# Patient Record
Sex: Female | Born: 1995 | Race: Black or African American | Hispanic: No | Marital: Single | Smoking: Former smoker
Health system: Southern US, Community
[De-identification: ages and names within clinical notes are randomized; demographics above are authoritative.]

## PROBLEM LIST (undated history)

## (undated) ENCOUNTER — Emergency Department (HOSPITAL_COMMUNITY): Admission: EM | Discharge status: Absent without leave | Source: Home / Self Care

## (undated) ENCOUNTER — Ambulatory Visit (HOSPITAL_COMMUNITY): Admission: EM | Source: Home / Self Care

## (undated) DIAGNOSIS — F32A Depression, unspecified: Secondary | ICD-10-CM

## (undated) DIAGNOSIS — F909 Attention-deficit hyperactivity disorder, unspecified type: Secondary | ICD-10-CM

## (undated) DIAGNOSIS — F419 Anxiety disorder, unspecified: Secondary | ICD-10-CM

## (undated) DIAGNOSIS — B019 Varicella without complication: Secondary | ICD-10-CM

## (undated) DIAGNOSIS — F329 Major depressive disorder, single episode, unspecified: Secondary | ICD-10-CM

## (undated) DIAGNOSIS — I10 Essential (primary) hypertension: Secondary | ICD-10-CM

## (undated) DIAGNOSIS — F319 Bipolar disorder, unspecified: Secondary | ICD-10-CM

## (undated) HISTORY — PX: ROOT CANAL: SHX2363

---
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
## 2017-05-16 ENCOUNTER — Emergency Department (HOSPITAL_BASED_OUTPATIENT_CLINIC_OR_DEPARTMENT_OTHER)
Admission: EM | Admit: 2017-05-16 | Discharge: 2017-05-16 | Disposition: A | Discharge status: Home / Self Care | Payer: Self-pay | Attending: Emergency Medicine | Admitting: Emergency Medicine

## 2017-05-16 ENCOUNTER — Emergency Department (HOSPITAL_BASED_OUTPATIENT_CLINIC_OR_DEPARTMENT_OTHER): Payer: Self-pay

## 2017-05-16 ENCOUNTER — Other Ambulatory Visit: Payer: Self-pay

## 2017-05-16 ENCOUNTER — Encounter (HOSPITAL_BASED_OUTPATIENT_CLINIC_OR_DEPARTMENT_OTHER): Payer: Self-pay | Admitting: Adult Health

## 2017-05-16 DIAGNOSIS — A599 Trichomoniasis, unspecified: Secondary | ICD-10-CM | POA: Insufficient documentation

## 2017-05-16 DIAGNOSIS — N760 Acute vaginitis: Secondary | ICD-10-CM | POA: Insufficient documentation

## 2017-05-16 DIAGNOSIS — Z87891 Personal history of nicotine dependence: Secondary | ICD-10-CM | POA: Insufficient documentation

## 2017-05-16 DIAGNOSIS — N730 Acute parametritis and pelvic cellulitis: Secondary | ICD-10-CM | POA: Insufficient documentation

## 2017-05-16 DIAGNOSIS — B9689 Other specified bacterial agents as the cause of diseases classified elsewhere: Secondary | ICD-10-CM | POA: Insufficient documentation

## 2017-05-16 DIAGNOSIS — F909 Attention-deficit hyperactivity disorder, unspecified type: Secondary | ICD-10-CM | POA: Insufficient documentation

## 2017-05-16 DIAGNOSIS — N898 Other specified noninflammatory disorders of vagina: Secondary | ICD-10-CM | POA: Insufficient documentation

## 2017-05-16 DIAGNOSIS — Z79899 Other long term (current) drug therapy: Secondary | ICD-10-CM | POA: Insufficient documentation

## 2017-05-16 HISTORY — DX: Essential (primary) hypertension: I10

## 2017-05-16 LAB — URINALYSIS, MICROSCOPIC (REFLEX)

## 2017-05-16 LAB — URINALYSIS, ROUTINE W REFLEX MICROSCOPIC
Bilirubin Urine: NEGATIVE
Glucose, UA: NEGATIVE mg/dL
HGB URINE DIPSTICK: NEGATIVE
Ketones, ur: 15 mg/dL — AB
Nitrite: POSITIVE — AB
Protein, ur: NEGATIVE mg/dL
pH: 6.5 (ref 5.0–8.0)

## 2017-05-16 LAB — WET PREP, GENITAL
Sperm: NONE SEEN
Yeast Wet Prep HPF POC: NONE SEEN

## 2017-05-16 LAB — PREGNANCY, URINE: PREG TEST UR: NEGATIVE

## 2017-05-16 MED ORDER — ONDANSETRON 4 MG PO TBDP
ORAL_TABLET | ORAL | 0 refills | Status: DC
Start: 2017-05-16 — End: 2022-11-14

## 2017-05-16 MED ORDER — SODIUM CHLORIDE 0.9 % IV BOLUS (SEPSIS)
1000.0000 mL | Freq: Once | INTRAVENOUS | Status: AC
  Administered 2017-05-16: 1000 mL via INTRAVENOUS

## 2017-05-16 MED ORDER — METRONIDAZOLE 500 MG PO TABS
2000.0000 mg | ORAL_TABLET | Freq: Once | ORAL | Status: AC
  Administered 2017-05-16: 2000 mg via ORAL
  Filled 2017-05-16: qty 4

## 2017-05-16 MED ORDER — AZITHROMYCIN 250 MG PO TABS
1000.0000 mg | ORAL_TABLET | Freq: Once | ORAL | Status: AC
  Administered 2017-05-16: 1000 mg via ORAL
  Filled 2017-05-16: qty 4

## 2017-05-16 MED ORDER — CEFTRIAXONE SODIUM 250 MG IJ SOLR
250.0000 mg | Freq: Once | INTRAMUSCULAR | Status: AC
  Administered 2017-05-16: 250 mg via INTRAMUSCULAR
  Filled 2017-05-16: qty 250

## 2017-05-16 MED ORDER — DOXYCYCLINE HYCLATE 100 MG PO CAPS: 100.0000 mg | ORAL_CAPSULE | Freq: Two times a day (BID) | ORAL | 0 refills | Status: AC

## 2017-05-16 MED ORDER — METRONIDAZOLE 500 MG PO TABS: 500.0000 mg | ORAL_TABLET | Freq: Two times a day (BID) | ORAL | 0 refills | Status: DC

## 2017-05-16 MED ORDER — ONDANSETRON 8 MG PO TBDP
8.0000 mg | ORAL_TABLET | Freq: Once | ORAL | Status: AC
  Administered 2017-05-16: 8 mg via ORAL
  Filled 2017-05-16: qty 1
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
## 2017-05-16 NOTE — Discharge Instructions (Signed)
You're being discharged with antibiotics for pelvic inflammatory disease as well as BV, it is important you complete full courses of both of these. Please call today to schedule an appointment for follow-up with her gynecologist. You need to use condoms for protection until both of these antibiotic courses were completed. You may take ibuprofen or Tylenol as needed for pain, Zofran for nausea. These antibiotics can cause some upset stomach and diarrhea, please take with food, do not consume alcohol while taking these medications will cause severe vomiting. If you have fevers and chills, worsening lower abdominal pain, nausea, vomiting or unable to keep down fluids or food please return to the ED sooner otherwise follow-up with her gynecologist and primary doctor.

## 2017-05-16 NOTE — ED Notes (Signed)
PT is tolerating POs

## 2017-05-16 NOTE — ED Provider Notes (Signed)
[facility] EMERGENCY DEPARTMENT Provider Note   CSN: [id number] Arrival date & time: 05/16/17  [id number]     History   Chief Complaint Chief Complaint  Patient presents with  . Exposure to STD    HPI  Elizabeth Cameron is a 21 y.o. Female with a history of bipolar disorder, anxiety and previous STDs, presents complaining of 2-1/2 weeks of vaginal discharge and lower abdominal cramping. Patient reports thick yellowish discharge with foul odor, with some associated pelvic discomfort. Patient reports suprapubic abdominal pain and cramping for the past 2 weeks. Reports some burning with urination, no hematuria or flank pain. Patient reports decreased appetite for the past 2 weeks with some nausea and a few episodes of intermittent vomiting, no hematemesis. Patient denies focal abdominal pain, diarrhea or constipation. Denies measured fevers, but reports she's woken up a sweat several times in the past week. Patient reports she thinks that she got this from a previous ex-boyfriend, and now has a new sexual partner who is here with similar symptoms. Patient unsure when her last menstrual period was.      Past Medical History:  Diagnosis Date  . ADHD (attention deficit hyperactivity disorder)    vyvanse, none in a couple of weeks due to running out  . Anxiety   . Bipolar 1 disorder (HCC)   . Depression   . Varicella    remote history    Patient Active Problem List   Diagnosis Date Noted  . Oppositional defiant disorder 12/10/2011  . Bipolar affective disorder, mixed, severe (HCC) 12/09/2011  . Cannabis abuse 12/09/2011  . 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 No pertinent surgical history.  OB History    Gravida Para Term Preterm AB Living   0 0 0 0 0 0   SAB TAB Ectopic Multiple Live Births   0 0 0 0         Home Medications    Prior to Admission medications   Medication Sig Start Date End Date Taking?  Authorizing Provider  azithromycin (ZITHROMAX Z-PAK) 250 MG tablet Use as directed 03/21/13   Excell SeltzerBaker, Adrian BlackwaterZachary H, PA-C  etodolac (LODINE) 500 MG tablet Take 1 tablet (500 mg total) by mouth 2 (two) times daily. 03/21/13   Baker, Adrian BlackwaterZachary H, PA-C  guaiFENesin-codeine 100-10 MG/5ML syrup Take 5 mLs by mouth 3 (three) times daily as needed for cough. 03/21/13   Graylon GoodBaker, Zachary H, PA-C  HYDROcodone-acetaminophen (NORCO/VICODIN) 5-325 MG per tablet Take 1 tablet by mouth every 4 (four) hours as needed for pain. 03/29/13   Schinlever, Santina Evansatherine, PA-C  methylPREDNISolone (MEDROL DOSEPAK) 4 MG tablet follow package directions 03/21/13   Graylon GoodBaker, Zachary H, PA-C    Family History Family History  Problem Relation Age of Onset  . Depression Mother        Lorazepam, prestige  . Anxiety disorder Mother   . Bipolar disorder Mother        seroquel  . Schizophrenia Paternal Grandmother     Social History Social History   Tobacco Use  . Smoking status: Former Smoker    Packs/day: 0.10    Years: 0.60    Pack years: 0.06    Types: Cigarettes  . Smokeless tobacco: Never Used  . Tobacco comment: Gets nauseous when she smokes too much.   Interested in Engineer, maintenance (IT)learning coping skills for anxiety, stress  Substance Use Topics  . Alcohol use: Yes    Comment: Last use around Christmas time.  Got drunk,  got sick, denies use since then  . Drug use: No    Comment: last use a couple of days ago, occasional use.  2 blunts.     Allergies   Patient has no known allergies.   Review of Systems Review of Systems  Constitutional: Positive for chills and fever.  HENT: Negative for congestion, mouth sores, rhinorrhea and sore throat.   Respiratory: Negative for cough and shortness of breath.   Cardiovascular: Negative for chest pain.  Gastrointestinal: Positive for abdominal pain, nausea and vomiting. Negative for constipation and diarrhea.  Genitourinary: Positive for dysuria, pelvic pain and vaginal discharge. Negative  for flank pain, genital sores, hematuria and vaginal bleeding.  Musculoskeletal: Negative for arthralgias and myalgias.  Skin: Negative for rash.  Neurological: Negative for dizziness, weakness, numbness and headaches.     Physical Exam Updated Vital Signs BP 120/70 (BP Location: Right Arm)   Pulse 78   Temp 98.9 F (37.2 C) (Oral)   Resp 18   SpO2 98%   Physical Exam  Constitutional: She appears well-developed and well-nourished. No distress.  HENT:  Head: Normocephalic and atraumatic.  Mouth/Throat: Oropharynx is clear and moist.  Eyes: Right eye exhibits no discharge. Left eye exhibits no discharge.  Neck: Neck supple.  Cardiovascular: Normal rate, regular rhythm, normal heart sounds and intact distal pulses.  Pulmonary/Chest: Effort normal and breath sounds normal. No stridor. No respiratory distress. She has no wheezes. She has no rales.  Lungs CTA bilaterally  Abdominal: Soft. Bowel sounds are normal. She exhibits no distension. There is tenderness. There is guarding.  Abdomen tender to palpation across the lower quadrants and suprapubic region with some guarding, no upper abdominal tenderness, negative Murphy sign, no rebound tenderness, no CVA tenderness  Genitourinary:  Genitourinary Comments: There is a moderate amount of thick yellowish discharge coming from the cervical os with notable cervicitis, and several nabothian cysts present, erythema of the vaginal walls, on bimanual exam there is cervical motion tenderness, no obvious adnexal masses, but tenderness is greater on the left than the right  Musculoskeletal: She exhibits no edema or deformity.  Lymphadenopathy:    She has no cervical adenopathy.  Neurological: She is alert. Coordination normal.  Skin: Skin is warm and dry. Capillary refill takes less than 2 seconds. She is not diaphoretic.  Psychiatric: She has a normal mood and affect. Her behavior is normal.  Nursing note and vitals reviewed.    ED  Treatments / Results  Labs (all labs ordered are listed, but only abnormal results are displayed) Labs Reviewed  WET PREP, GENITAL - Abnormal; Notable for the following components:      Result Value   Trich, Wet Prep PRESENT (*)    Clue Cells Wet Prep HPF POC PRESENT (*)    WBC, Wet Prep HPF POC MANY (*)    All other components within normal limits  URINALYSIS, ROUTINE W REFLEX MICROSCOPIC - Abnormal; Notable for the following components:   Specific Gravity, Urine >1.030 (*)    Ketones, ur 15 (*)    Nitrite POSITIVE (*)    Leukocytes, UA SMALL (*)    All other components within normal limits  URINALYSIS, MICROSCOPIC (REFLEX) - Abnormal; Notable for the following components:   Bacteria, UA MANY (*)    Squamous Epithelial / LPF 0-5 (*)    All other components within normal limits  PREGNANCY, URINE  RPR  HIV ANTIBODY (ROUTINE TESTING)  GC/CHLAMYDIA PROBE AMP ([place]) NOT AT [facility]    EKG  EKG Interpretation None       Radiology US Transvaginal Non-ob  Result Date: 05/16/2017 CLINICAL DATA:  Pelvic pain, vaginal discharge EXAM: TRANSABDOMINAL AND TRANSVAGINAL ULTRASOUND OF PELVIS DOPPLER ULTRASOUND OF OVARIES TECHNIQUE: Both transabdominal and transvaginal ultrasound examinations of the pelvis were performed. Transabdominal technique was performed for global imaging of the pelvis including uterus, ovaries, adnexal regions, and pelvic cul-de-sac. It was necessary to proceed with endovaginal exam following the transabdominal exam to visualize the endometrium and ovaries. Color and duplex Doppler ultrasound was utilized to evaluate blood flow to the ovaries. COMPARISON:  None. FINDINGS: Uterus Measurements: 9.9 x 5.1 x 6.9 cm. No fibroids or other mass visualized. Endometrium Thickness: 14 mm in thickness.  No focal abnormality visualized. Right ovary Measurements: 4.4 x 3.2 x 4.1 cm. Dominant follicle or corpus luteum cyst measures 3.2 cm. No adnexal mass. Left ovary Measurements:  2.7 x 1.9 x 3.0 cm. Normal appearance/no adnexal mass. Pulsed Doppler evaluation of both ovaries demonstrates normal low-resistance arterial and venous waveforms. Other findings Trace free fluid. IMPRESSION: No evidence of ovarian torsion.  Unremarkable study. Electronically Signed   By: Charlett Nose M.D.   On: 05/16/2017 12:44   US Pelvis Complete  Result Date: 05/16/2017 CLINICAL DATA:  Pelvic pain, vaginal discharge EXAM: TRANSABDOMINAL AND TRANSVAGINAL ULTRASOUND OF PELVIS DOPPLER ULTRASOUND OF OVARIES TECHNIQUE: Both transabdominal and transvaginal ultrasound examinations of the pelvis were performed. Transabdominal technique was performed for global imaging of the pelvis including uterus, ovaries, adnexal regions, and pelvic cul-de-sac. It was necessary to proceed with endovaginal exam following the transabdominal exam to visualize the endometrium and ovaries. Color and duplex Doppler ultrasound was utilized to evaluate blood flow to the ovaries. COMPARISON:  None. FINDINGS: Uterus Measurements: 9.9 x 5.1 x 6.9 cm. No fibroids or other mass visualized. Endometrium Thickness: 14 mm in thickness.  No focal abnormality visualized. Right ovary Measurements: 4.4 x 3.2 x 4.1 cm. Dominant follicle or corpus luteum cyst measures 3.2 cm. No adnexal mass. Left ovary Measurements: 2.7 x 1.9 x 3.0 cm. Normal appearance/no adnexal mass. Pulsed Doppler evaluation of both ovaries demonstrates normal low-resistance arterial and venous waveforms. Other findings Trace free fluid. IMPRESSION: No evidence of ovarian torsion.  Unremarkable study. Electronically Signed   By: Charlett Nose M.D.   On: 05/16/2017 12:44   [place] Art/ven Flow Abd Pelv Doppler  Result Date: 05/16/2017 CLINICAL DATA:  Pelvic pain, vaginal discharge EXAM: TRANSABDOMINAL AND TRANSVAGINAL ULTRASOUND OF PELVIS DOPPLER ULTRASOUND OF OVARIES TECHNIQUE: Both transabdominal and transvaginal ultrasound examinations of the pelvis were performed. Transabdominal  technique was performed for global imaging of the pelvis including uterus, ovaries, adnexal regions, and pelvic cul-de-sac. It was necessary to proceed with endovaginal exam following the transabdominal exam to visualize the endometrium and ovaries. Color and duplex Doppler ultrasound was utilized to evaluate blood flow to the ovaries. COMPARISON:  None. FINDINGS: Uterus Measurements: 9.9 x 5.1 x 6.9 cm. No fibroids or other mass visualized. Endometrium Thickness: 14 mm in thickness.  No focal abnormality visualized. Right ovary Measurements: 4.4 x 3.2 x 4.1 cm. Dominant follicle or corpus luteum cyst measures 3.2 cm. No adnexal mass. Left ovary Measurements: 2.7 x 1.9 x 3.0 cm. Normal appearance/no adnexal mass. Pulsed Doppler evaluation of both ovaries demonstrates normal low-resistance arterial and venous waveforms. Other findings Trace free fluid. IMPRESSION: No evidence of ovarian torsion.  Unremarkable study. Electronically Signed   By: Charlett Nose M.D.   On: 05/16/2017 12:44    Procedures Procedures (including critical care  time)  Medications Ordered in ED Medications  sodium chloride 0.9 % bolus 1,000 mL (0 mLs Intravenous Stopped 05/16/17 1248)  cefTRIAXone (ROCEPHIN) injection 250 mg (250 mg Intramuscular Given 05/16/17 1131)  azithromycin (ZITHROMAX) tablet 1,000 mg (1,000 mg Oral Given 05/16/17 1129)  metroNIDAZOLE (FLAGYL) tablet 2,000 mg (2,000 mg Oral Given 05/16/17 1129)  ondansetron (ZOFRAN-ODT) disintegrating tablet 8 mg (8 mg Oral Given 05/16/17 1129)     Initial Impression / Assessment and Plan / ED Course  I have reviewed the triage vital signs and the nursing notes.  Pertinent labs & imaging results that were available during my care of the patient were reviewed by me and considered in my medical decision making (see chart for details).  Patient presents with 2-1/2 weeks of vaginal discharge and lower abdominal pain, with some associated nausea, decreased appetite and  chills. Patient is overall well-appearing, and vitals are normal. On exam patient has some lower abdominal tenderness, pelvic exam reveals purulent discharge, as well as cervical motion tenderness and adnexal tenderness on the left. Wet prep and GC/Chlamydia cultures obtained during exam. Given cervical motion and left adnexal tenderness, will get ultrasound as well to rule out TOA. Pt endorses some nausea, as well as decreased appetite will give Zofran and IV fluids.  Urinalysis shows evidence of infection, but given copious vaginal discharge on exam, this is likely the source. Urine pregnancy is negative. Wet prep shows Trichomonas, clue cells and many WBCs. HIV, RPR and GC/Chlamydia are pending. Pelvic ultrasound was unremarkable with no evidence of TOA or ovarian torsion. Will treat patient today for trichomoniasis, and initiate treatment for PID, Flagyl, azithromycin and ceftriaxone given today, patient will be sent home with doxycycline for 2 weeks, as well as Flagyl for BV. Counseled patient on side effects of these medications, and not to drink alcohol. Stressed importance of follow-up with her gynecologist, and the use of condoms for protection, as patient has had multiple STDs. Patient is tolerating PO in the ED without issue, and is stable for discharge home, with gynecology follow-up. Strict return precautions discussed patient expresses understanding and is in agreement with plan  Final Clinical Impressions(s) / ED Diagnoses   Final diagnoses:  Vaginal discharge  BV (bacterial vaginosis)  Trichomoniasis  PID (acute pelvic inflammatory disease)    ED Discharge Orders        Ordered    metroNIDAZOLE (FLAGYL) 500 MG tablet  2 times daily     05/16/17 1313    doxycycline (VIBRAMYCIN) 100 MG capsule  2 times daily     05/16/17 1313    ondansetron (ZOFRAN ODT) 4 MG disintegrating tablet     05/16/17 1321       Jodi Geralds [place], [place] 05/17/17 0836    Ward, Layla Maw, DO 05/22/17  2305

## 2017-05-17 LAB — HIV ANTIBODY (ROUTINE TESTING W REFLEX): HIV SCREEN 4TH GENERATION: NONREACTIVE

## 2017-05-17 LAB — GC/CHLAMYDIA PROBE AMP (~~LOC~~) NOT AT ARMC
Chlamydia: NEGATIVE
NEISSERIA GONORRHEA: POSITIVE — AB

## 2017-05-17 LAB — RPR: RPR Ser Ql: NONREACTIVE

## 2017-07-02 ENCOUNTER — Encounter (HOSPITAL_COMMUNITY): Payer: Self-pay

## 2017-07-02 DIAGNOSIS — R102 Pelvic and perineal pain: Secondary | ICD-10-CM | POA: Diagnosis present

## 2017-07-02 DIAGNOSIS — Z87891 Personal history of nicotine dependence: Secondary | ICD-10-CM | POA: Diagnosis not present

## 2017-07-02 DIAGNOSIS — I10 Essential (primary) hypertension: Secondary | ICD-10-CM | POA: Insufficient documentation

## 2017-07-02 DIAGNOSIS — N83201 Unspecified ovarian cyst, right side: Secondary | ICD-10-CM | POA: Insufficient documentation

## 2017-07-02 DIAGNOSIS — Z79899 Other long term (current) drug therapy: Secondary | ICD-10-CM | POA: Diagnosis not present

## 2017-07-02 DIAGNOSIS — F909 Attention-deficit hyperactivity disorder, unspecified type: Secondary | ICD-10-CM | POA: Diagnosis not present

## 2017-07-02 LAB — CBC
HCT: 38.7 % (ref 36.0–46.0)
Hemoglobin: 12.9 g/dL (ref 12.0–15.0)
MCH: 28.7 pg (ref 26.0–34.0)
MCHC: 33.3 g/dL (ref 30.0–36.0)
MCV: 86 fL (ref 78.0–100.0)
PLATELETS: 205 10*3/uL (ref 150–400)
RBC: 4.5 MIL/uL (ref 3.87–5.11)
RDW: 14 % (ref 11.5–15.5)
WBC: 6.2 10*3/uL (ref 4.0–10.5)

## 2017-07-02 LAB — URINALYSIS, ROUTINE W REFLEX MICROSCOPIC
Bilirubin Urine: NEGATIVE
GLUCOSE, UA: NEGATIVE mg/dL
Hgb urine dipstick: NEGATIVE
Ketones, ur: NEGATIVE mg/dL
LEUKOCYTES UA: NEGATIVE
NITRITE: NEGATIVE
PROTEIN: NEGATIVE mg/dL
Specific Gravity, Urine: 1.028 (ref 1.005–1.030)
pH: 5 (ref 5.0–8.0)

## 2017-07-02 LAB — COMPREHENSIVE METABOLIC PANEL
ALT: 23 U/L (ref 14–54)
AST: 18 U/L (ref 15–41)
Albumin: 4.1 g/dL (ref 3.5–5.0)
Alkaline Phosphatase: 51 U/L (ref 38–126)
Anion gap: 9 (ref 5–15)
BUN: 9 mg/dL (ref 6–20)
CHLORIDE: 108 mmol/L (ref 101–111)
CO2: 20 mmol/L — ABNORMAL LOW (ref 22–32)
CREATININE: 1.05 mg/dL — AB (ref 0.44–1.00)
Calcium: 8.7 mg/dL — ABNORMAL LOW (ref 8.9–10.3)
GFR calc non Af Amer: >60 mL/min (ref >60)
Glucose, Bld: 120 mg/dL — ABNORMAL HIGH (ref 65–99)
POTASSIUM: 3.2 mmol/L — AB (ref 3.5–5.1)
Sodium: 137 mmol/L (ref 135–145)
TOTAL PROTEIN: 6.5 g/dL (ref 6.5–8.1)
Total Bilirubin: 0.4 mg/dL (ref 0.3–1.2)

## 2017-07-02 LAB — I-STAT BETA HCG BLOOD, ED (MC, WL, AP ONLY)

## 2017-07-02 LAB — LIPASE, BLOOD: LIPASE: 29 U/L (ref 11–51)

## 2017-07-02 NOTE — ED Triage Notes (Signed)
C/o lower abdominal pain for the last 3 days, hx of cysts.  Pt states she recently had an ultrasound in December and was told she has a current cyst.

## 2017-07-03 ENCOUNTER — Emergency Department (HOSPITAL_COMMUNITY)
Admission: EM | Admit: 2017-07-03 | Discharge: 2017-07-03 | Disposition: A | Discharge status: Home / Self Care | Payer: Medicaid Other | Attending: Emergency Medicine | Admitting: Emergency Medicine

## 2017-07-03 ENCOUNTER — Emergency Department (HOSPITAL_COMMUNITY): Payer: Medicaid Other

## 2017-07-03 DIAGNOSIS — N83201 Unspecified ovarian cyst, right side: Secondary | ICD-10-CM

## 2017-07-03 LAB — WET PREP, GENITAL
Clue Cells Wet Prep HPF POC: NONE SEEN
Sperm: NONE SEEN
TRICH WET PREP: NONE SEEN
YEAST WET PREP: NONE SEEN

## 2017-07-03 LAB — GC/CHLAMYDIA PROBE AMP (~~LOC~~) NOT AT ARMC

## 2017-07-03 MED ORDER — PROMETHAZINE HCL 25 MG PO TABS: 25.0000 mg | ORAL_TABLET | Freq: Four times a day (QID) | ORAL | 0 refills | Status: DC | PRN

## 2017-07-03 MED ORDER — IBUPROFEN 800 MG PO TABS
800.0000 mg | ORAL_TABLET | Freq: Once | ORAL | Status: AC
  Administered 2017-07-03: 800 mg via ORAL
  Filled 2017-07-03: qty 1

## 2017-07-03 MED ORDER — PROMETHAZINE HCL 25 MG PO TABS
25.0000 mg | ORAL_TABLET | Freq: Once | ORAL | Status: AC
  Administered 2017-07-03: 25 mg via ORAL
  Filled 2017-07-03: qty 1

## 2017-07-03 NOTE — ED Notes (Signed)
ED Provider at bedside. 

## 2017-07-03 NOTE — Discharge Instructions (Signed)
You may alternate Tylenol 1000 mg every 6 hours as needed for pain and Ibuprofen 800 mg every 8 hours as needed for pain.  Please take Ibuprofen with food.  Please schedule an appointment to follow-[facility].     [email or web address] [address] [place], [place] [phone]    [facility] [email or web address] [address] [place], [place] [phone]    [facility] [address] [place], [place] [phone]   [facility] [email or web address] [address] [place], [place] [phone]   [facility] [email or web address] [address] [place], [place] [phone]   [facility] [email or web address] [address] [place], [place] [phone]

## 2017-07-03 NOTE — ED Notes (Signed)
Patient transported to Ultrasound 

## 2017-07-03 NOTE — ED Provider Notes (Signed)
TIME SEEN: 1:26 AM  CHIEF COMPLAINT: Pelvic pain  HPI: Patient is a 22 year old G3 P3 who presents to the emergency department with complaints of pelvic pain, vaginal discharge.  Was seen here on December 11 and had an ultrasound which showed right corpus luteum cyst measuring 3.2 cm versus dominant follicle.  She was positive for trichomonas, BV and ended up having a positive gonorrhea test.  She was treated with Flagyl for a week as well as doxycycline for 2 weeks.  Given azithromycin and ceftriaxone IM in the emergency department prior to discharge reports symptoms improved but now came back for the past couple of days.  Describes it as a pressure, sharp pain.  Has had vomiting while taking the doxycycline.  States she did not take the last 5 pills of her doxycycline prescription.  She is sexually active with the same partner who also tested positive for STDs.  He reports that he was treated and has not had any symptoms.  She states that she thinks he has been monogamous.  ROS: See HPI Constitutional: no fever  Eyes: no drainage  ENT: no runny nose   Cardiovascular:  no chest pain  Resp: no SOB  GI: no vomiting GU: no dysuria Integumentary: no rash  Allergy: no hives  Musculoskeletal: no leg swelling  Neurological: no slurred speech ROS otherwise negative  PAST MEDICAL HISTORY/PAST SURGICAL HISTORY:  Past Medical History:  Diagnosis Date  . ADHD (attention deficit hyperactivity disorder)    vyvanse, none in a couple of weeks due to running out  . Anxiety   . Bipolar 1 disorder (HCC)   . Depression   . Hypertension    takes Procardia 30mg   . Varicella    remote history    MEDICATIONS:  Prior to Admission medications   Medication Sig Start Date End Date Taking? Authorizing Provider  azithromycin (ZITHROMAX Z-PAK) 250 MG tablet Use as directed 03/21/13   Excell SeltzerBaker, Adrian BlackwaterZachary H, PA-C  etodolac (LODINE) 500 MG tablet Take 1 tablet (500 mg total) by mouth 2 (two) times daily. 03/21/13    Baker, Adrian BlackwaterZachary H, PA-C  guaiFENesin-codeine 100-10 MG/5ML syrup Take 5 mLs by mouth 3 (three) times daily as needed for cough. 03/21/13   Graylon GoodBaker, Zachary H, PA-C  HYDROcodone-acetaminophen (NORCO/VICODIN) 5-325 MG per tablet Take 1 tablet by mouth every 4 (four) hours as needed for pain. 03/29/13   Schinlever, Santina Evansatherine, PA-C  methylPREDNISolone (MEDROL DOSEPAK) 4 MG tablet follow package directions 03/21/13   Excell SeltzerBaker, Adrian BlackwaterZachary H, PA-C  metroNIDAZOLE (FLAGYL) 500 MG tablet Take 1 tablet (500 mg total) by mouth 2 (two) times daily. One po bid x 7 days 05/16/17   Dartha LodgeFord, Kelsey N, PA-C  ondansetron [facility](ZOFRAN ODT) 4 MG disintegrating tablet 4mg  ODT q4 hours prn nausea/vomit 05/16/17   Dartha LodgeFord, Kelsey N, PA-C    ALLERGIES:  Allergies  Allergen Reactions  . Penicillins Anaphylaxis    SOCIAL HISTORY:  Social History   Tobacco Use  . Smoking status: Former Smoker    Packs/day: 0.10    Years: 0.60    Pack years: 0.06    Types: Cigarettes  . Smokeless tobacco: Never Used  . Tobacco comment: Gets nauseous when she smokes too much.   Interested in Engineer, maintenance (IT)learning coping skills for anxiety, stress  Substance Use Topics  . Alcohol use: Yes    Comment: Last use around Christmas time.  Got drunk, got sick, denies use since then    FAMILY HISTORY: Family History  Problem Relation Age of Onset  .  Depression Mother        Lorazepam, prestige  . Anxiety disorder Mother   . Bipolar disorder Mother        seroquel  . Schizophrenia Paternal Grandmother     EXAM: BP 113/70 (BP Location: Right Arm)   Pulse 86   Temp 98 F (36.7 C) (Oral)   Resp 18   Ht 5\' 1"  (1.549 m)   Wt 65.8 kg (145 lb)   SpO2 100%   BMI 27.40 kg/m  CONSTITUTIONAL: Alert and oriented and responds appropriately to questions. Well-appearing; well-nourished HEAD: Normocephalic EYES: Conjunctivae clear, pupils appear equal, EOMI ENT: normal nose; moist mucous membranes NECK: Supple, no meningismus, no nuchal rigidity, no LAD  CARD:  RRR; S1 and S2 appreciated; no murmurs, no clicks, no rubs, no gallops RESP: Normal chest excursion without splinting or tachypnea; breath sounds clear and equal bilaterally; no wheezes, no rhonchi, no rales, no hypoxia or respiratory distress, speaking full sentences ABD/GI: Normal bowel sounds; non-distended; soft, non-tender, no rebound, no guarding, no peritoneal signs, no hepatosplenomegaly, no tenderness at McBurney's point GU:  Normal external genitalia. No lesions, rashes noted. Patient has no vaginal bleeding on exam.  No significant vaginal discharge.  Patient has no cervical motion tenderness on exam.  She does have bilateral adnexal tenderness worse on the right side than the left.  Cervix is not appear friable.  Cervix is closed.  Chaperone present for exam. BACK:  The back appears normal and is non-tender to palpation, there is no CVA tenderness EXT: Normal ROM in all joints; non-tender to palpation; no edema; normal capillary refill; no cyanosis, no calf tenderness or swelling    SKIN: Normal color for age and race; warm; no rash NEURO: Moves all extremities equally PSYCH: The patient's mood and manner are appropriate. Grooming and personal hygiene are appropriate.  MEDICAL DECISION MAKING: Patient here with pelvic pain.  Urine shows no sign of infection.  She is not pregnant.  Recent corpus luteum cyst seen on ultrasound.  Requesting another ultrasound today which I feel is reasonable given she was positive for gonorrhea and trichomonas during her last visit.  Did not complete a course of doxycycline.  Still sexually active with the same partner.  He states he was treated.  She was negative for HIV, syphilis and chlamydia during her last visit.  Will obtain ultrasound to evaluate for another cyst versus torsion but also TOA.  Doubt appendicitis.  Will give ibuprofen and Phenergan for symptomatic relief.  ED PROGRESS: Patient's current chlamydia test pending.  Wet prep shows nothing  significant.  Ultrasound shows benign physiologic right ovarian cyst.  No torsion.  She had no cervical motion tenderness on exam and no significant discharge.  Have advised her to follow-up with her OB/GYN and [place].  Recommended continue ibuprofen and alternating with Tylenol for pain.  Will discharge with prescription of Phenergan.  I do not feel this time she needs further treatment for PID.   At this time, I do not feel there is any life-threatening condition present. I have reviewed and discussed all results (EKG, imaging, lab, urine as appropriate) and exam findings with patient/family. I have reviewed nursing notes and appropriate previous records.  I feel the patient is safe to be discharged home without further emergent workup and can continue workup as an outpatient as needed. Discussed usual and customary return precautions. Patient/family verbalize understanding and are comfortable with this plan.  Outpatient follow-up has been provided if needed. All questions have  been answered.      Sidhant Helderman, Layla Maw, DO 07/03/17 [phone]

## 2019-01-01 ENCOUNTER — Ambulatory Visit (INDEPENDENT_AMBULATORY_CARE_PROVIDER_SITE_OTHER): Payer: Self-pay | Admitting: Licensed Clinical Social Worker

## 2019-01-01 DIAGNOSIS — F332 Major depressive disorder, recurrent severe without psychotic features: Secondary | ICD-10-CM

## 2019-01-03 ENCOUNTER — Encounter (HOSPITAL_COMMUNITY): Payer: Self-pay | Admitting: Licensed Clinical Social Worker

## 2019-01-03 NOTE — Progress Notes (Signed)
Elizabeth Cameron is a 23 y.o. female patient who presents for assessment for medications and talk therapy. PT expressed interest in specific grief counseling for child loss and women's issues. An outside referral was made to [facility] in [place]. Contact information was provided and PT agreed to f/u w/ referral as needed.         Archie Balboa, LCAS-A

## 2019-01-29 ENCOUNTER — Ambulatory Visit (HOSPITAL_COMMUNITY): Payer: Medicaid Other | Admitting: Psychiatry

## 2019-01-29 ENCOUNTER — Other Ambulatory Visit: Payer: Self-pay

## 2019-02-10 IMAGING — US US TRANSVAGINAL NON-OB
1 series · 13 of 25 positions shown · non-contrast
Comparison: None.

CLINICAL DATA: Pelvic pain, vaginal discharge

EXAM:
TRANSABDOMINAL AND TRANSVAGINAL ULTRASOUND OF PELVIS
DOPPLER ULTRASOUND OF OVARIES
TECHNIQUE: Both transabdominal and transvaginal ultrasound examinations of the
pelvis were performed. Transabdominal technique was performed for
global imaging of the pelvis including uterus, ovaries, adnexal
regions, and pelvic cul-de-sac.
It was necessary to proceed with endovaginal exam following the
transabdominal exam to visualize the endometrium and ovaries. Color
and duplex Doppler ultrasound was utilized to evaluate blood flow to
the ovaries.

[Series 1: us transvaginal non-ob · 0.22mm/px · 13 of 109 slices shown]
[im 1/109]
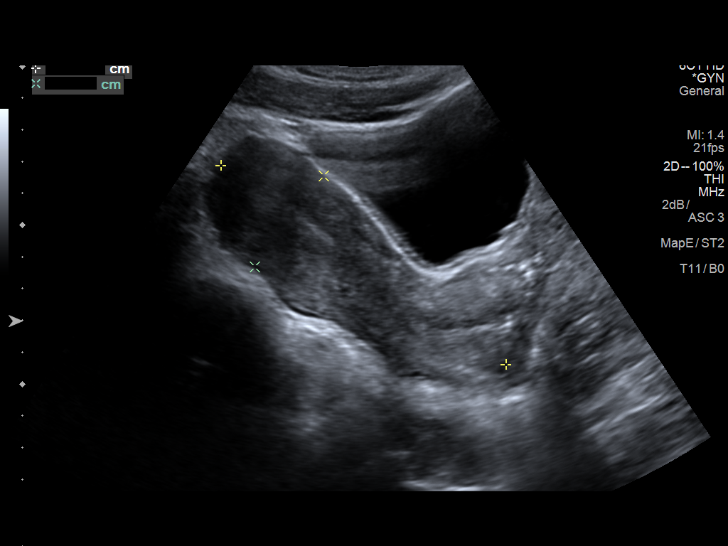
[im 10/109]
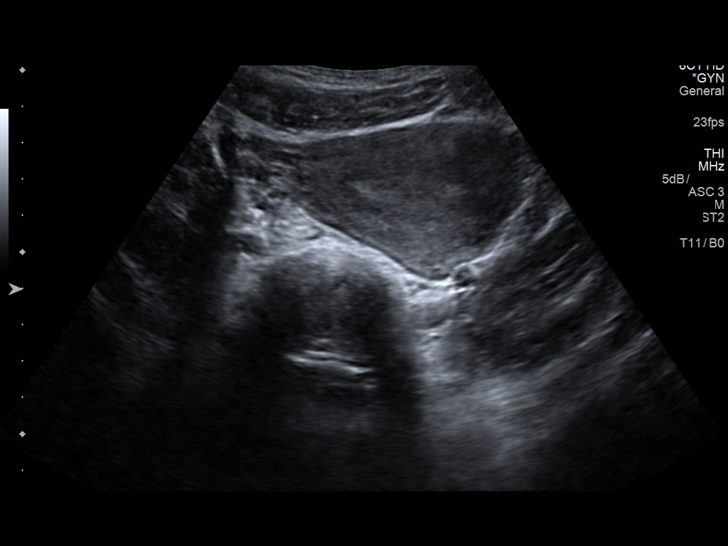
[im 19/109]
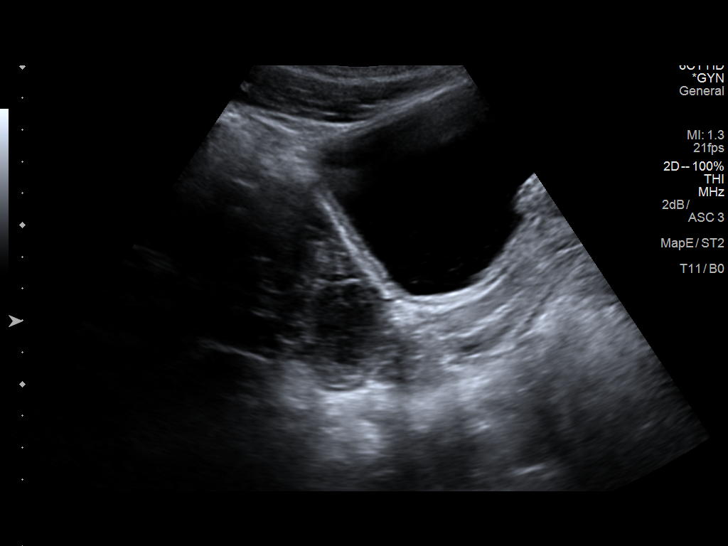
[im 28/109]
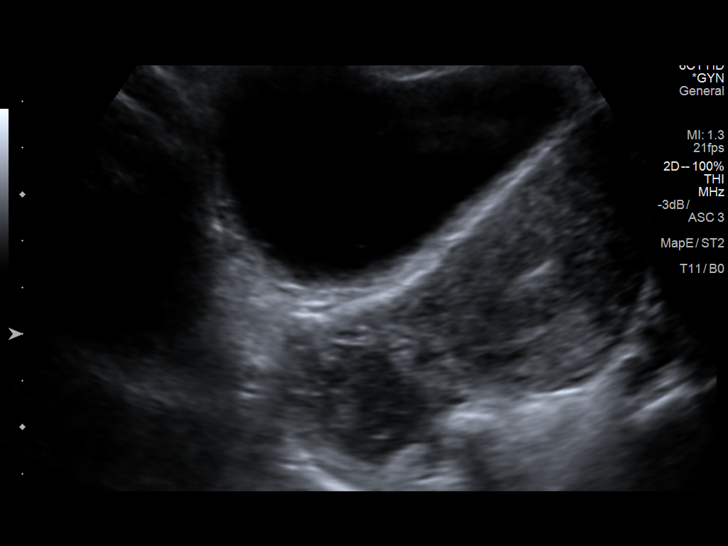
[im 37/109]
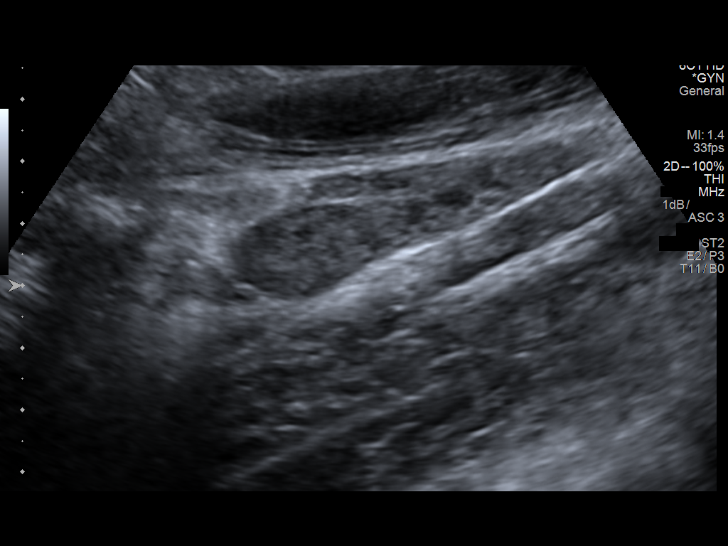
[im 46/109]
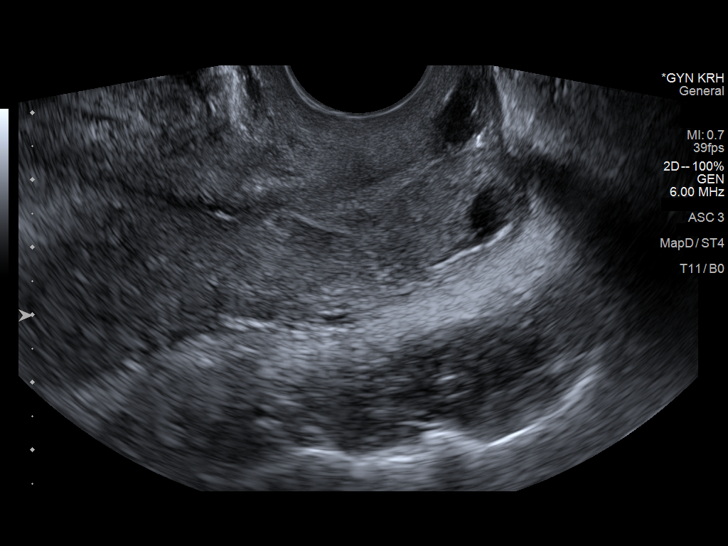
[im 55/109]
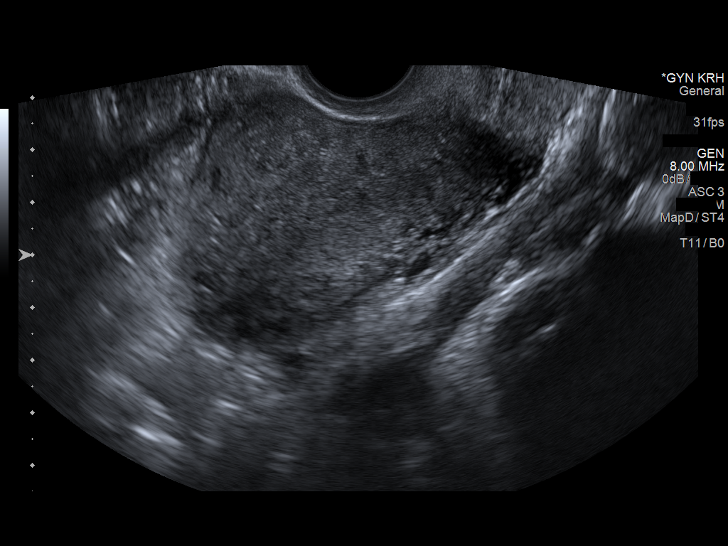
[im 64/109]
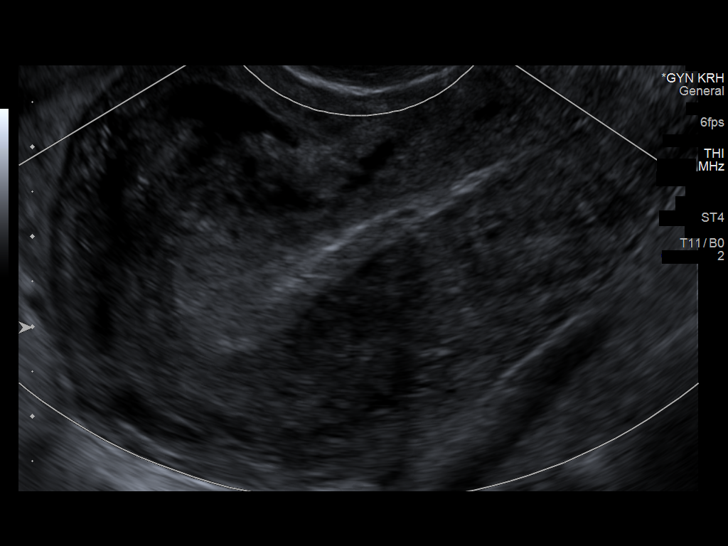
[im 73/109]
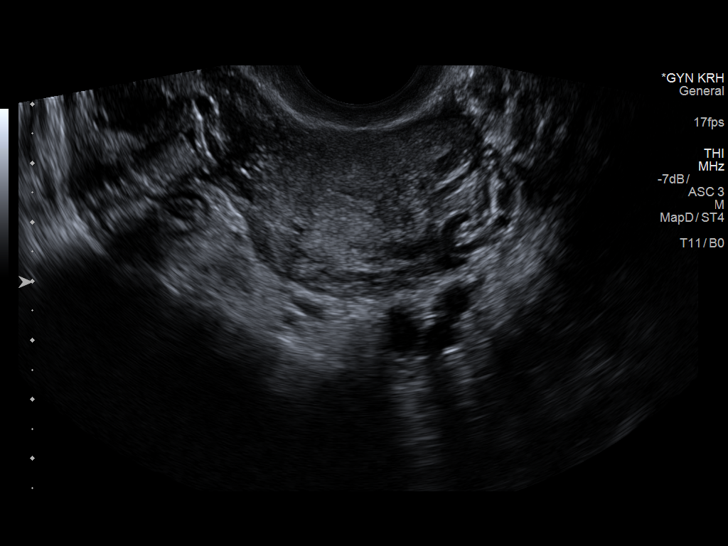
[im 82/109]
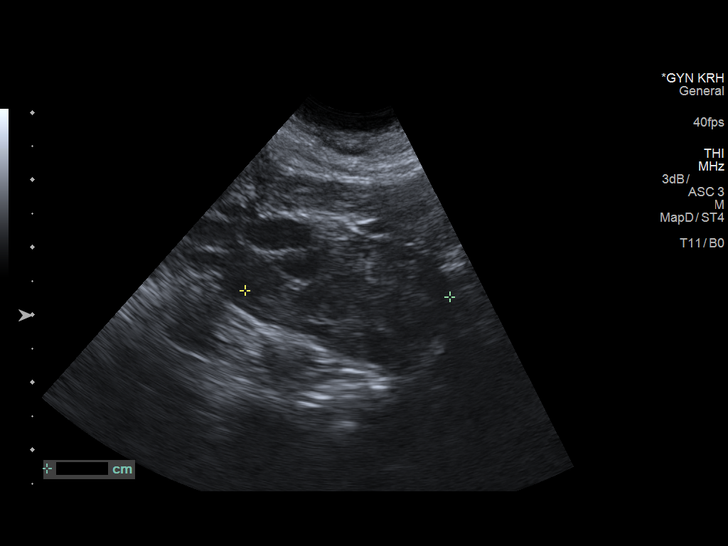
[im 91/109]
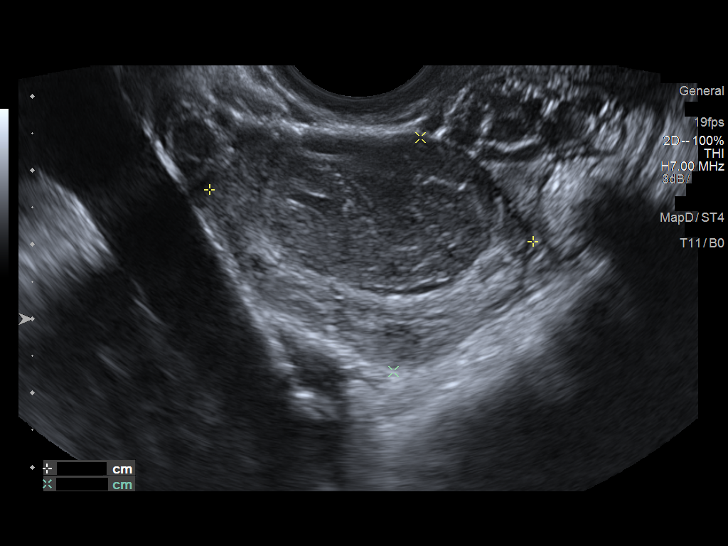
[im 100/109]
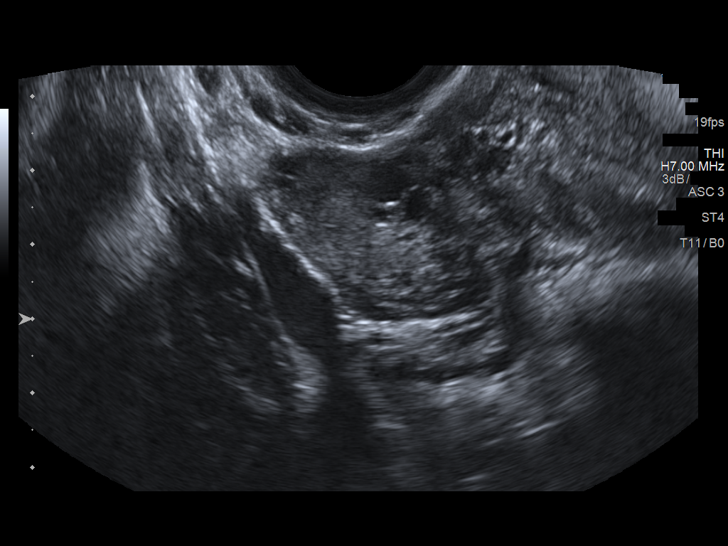
[im 109/109]
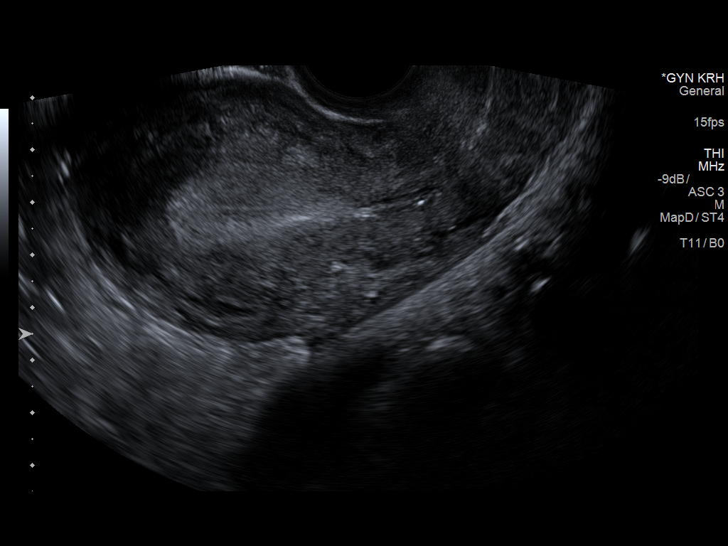

[13 of 25 positions shown; findings below may reference images not displayed]

FINDINGS: Uterus

Measurements: 9.9 x 5.1 x 6.9 cm. No fibroids or other mass
visualized.

Endometrium

Thickness: 14 mm in thickness.  No focal abnormality visualized.

Right ovary

Measurements: 4.4 x 3.2 x 4.1 cm. Dominant follicle or corpus luteum
cyst measures 3.2 cm.. No adnexal mass.

Left ovary

Measurements: 2.7 x 1.9 x 3.0 cm. Normal appearance/no adnexal mass.

Pulsed Doppler evaluation of both ovaries demonstrates normal
low-resistance arterial and venous waveforms.

Other findings

Trace free fluid.
IMPRESSION: No evidence of ovarian torsion.  Unremarkable study.

## 2019-03-30 IMAGING — US US TRANSVAGINAL NON-OB
1 series · 13 of 25 positions shown · non-contrast
Comparison: 05/16/2017

CLINICAL DATA: Right greater than left adnexal pelvic pain.

EXAM:
TRANSABDOMINAL AND TRANSVAGINAL ULTRASOUND OF PELVIS
DOPPLER ULTRASOUND OF OVARIES
TECHNIQUE: Both transabdominal and transvaginal ultrasound examinations of the
pelvis were performed. Transabdominal technique was performed for
global imaging of the pelvis including uterus, ovaries, adnexal
regions, and pelvic cul-de-sac.
It was necessary to proceed with endovaginal exam following the
transabdominal exam to visualize the endometrium and ovaries.. Color
and duplex Doppler ultrasound was utilized to evaluate blood flow to
the ovaries.

[Series 1: us transvaginal non-ob · 0.21mm/px · 13 of 78 slices shown]
[im 1/78]
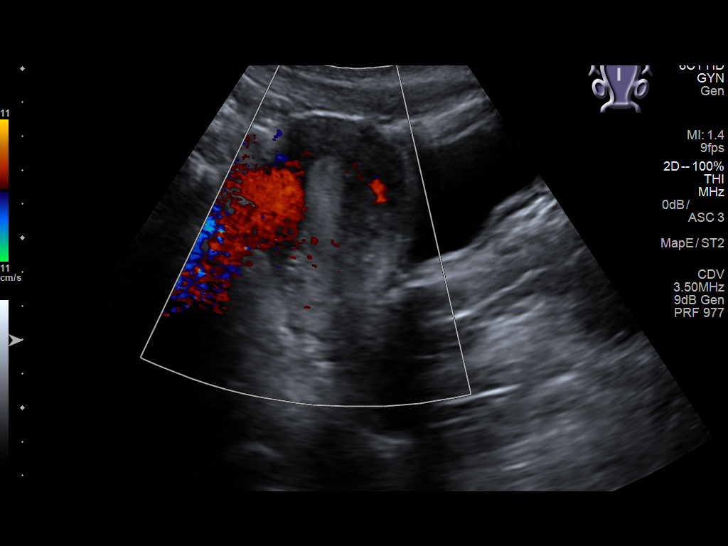
[im 7/78]
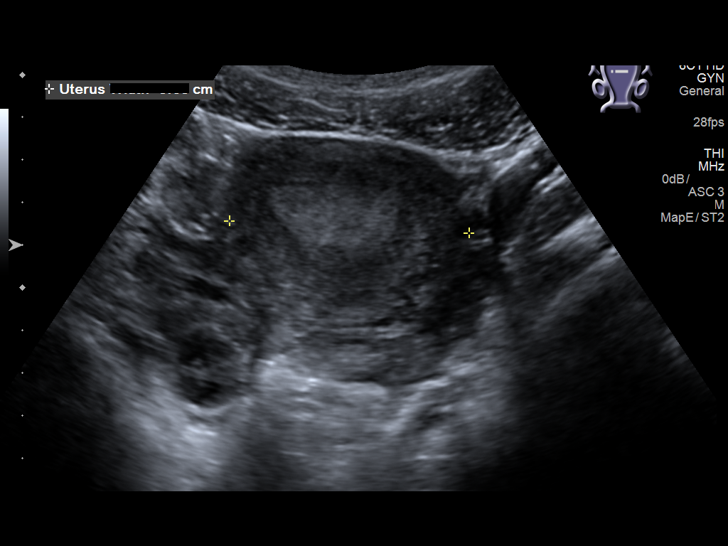
[im 13/78]
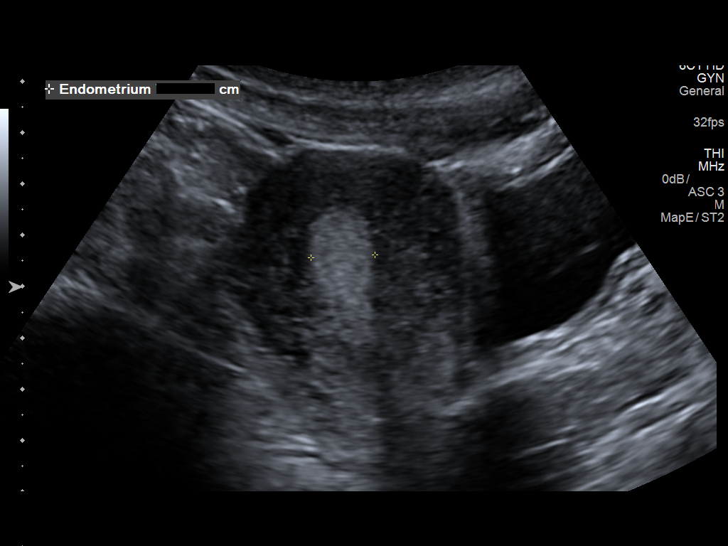
[im 20/78]
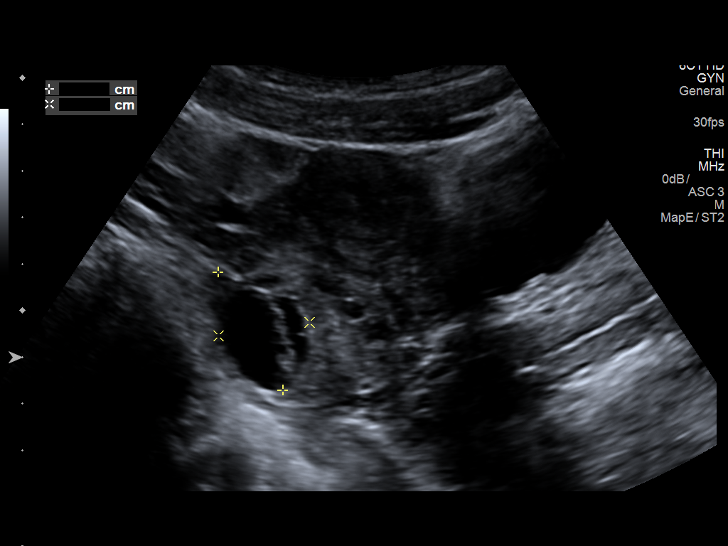
[im 26/78]
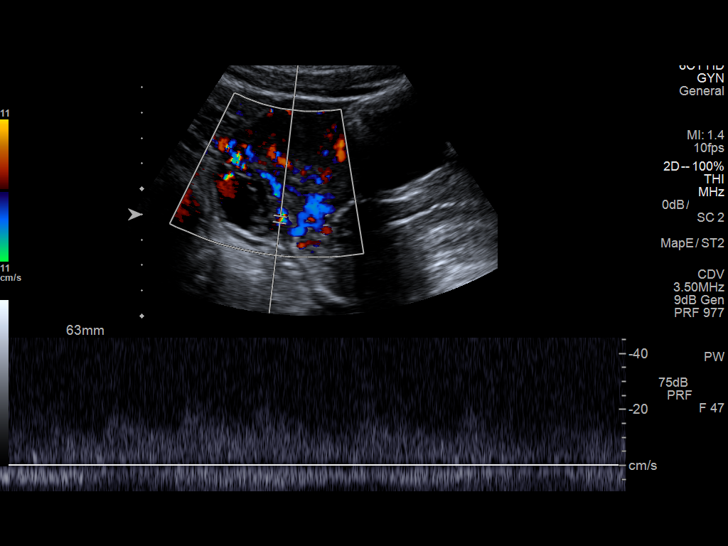
[im 33/78]
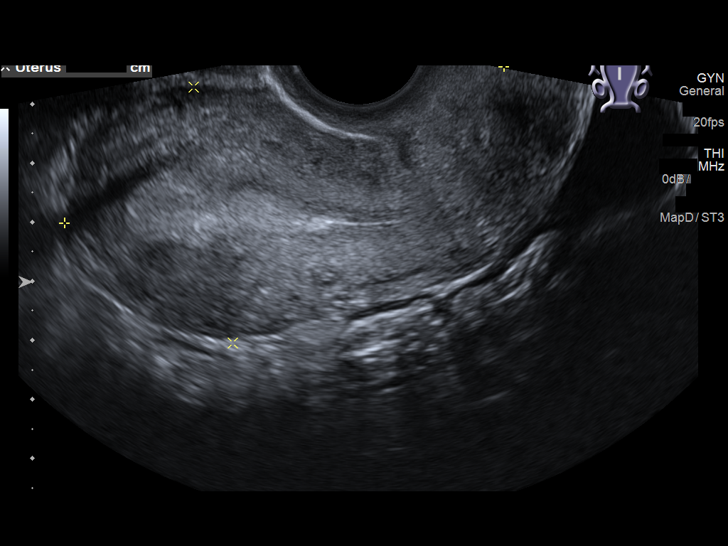
[im 39/78]
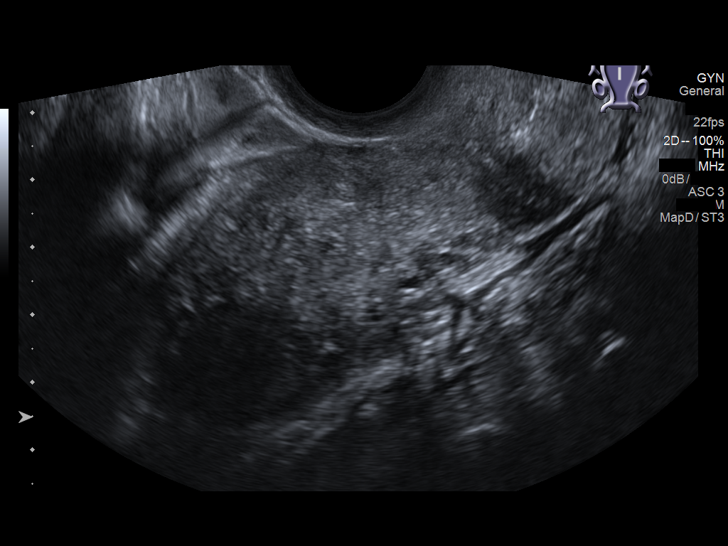
[im 45/78]
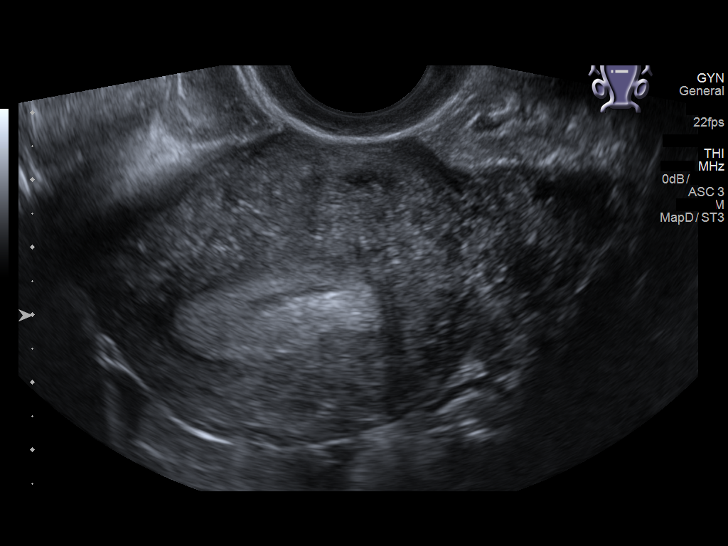
[im 52/78]
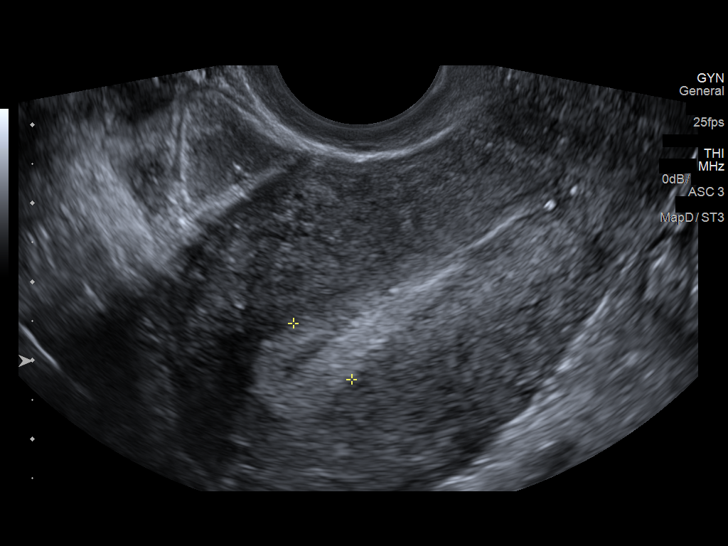
[im 58/78]
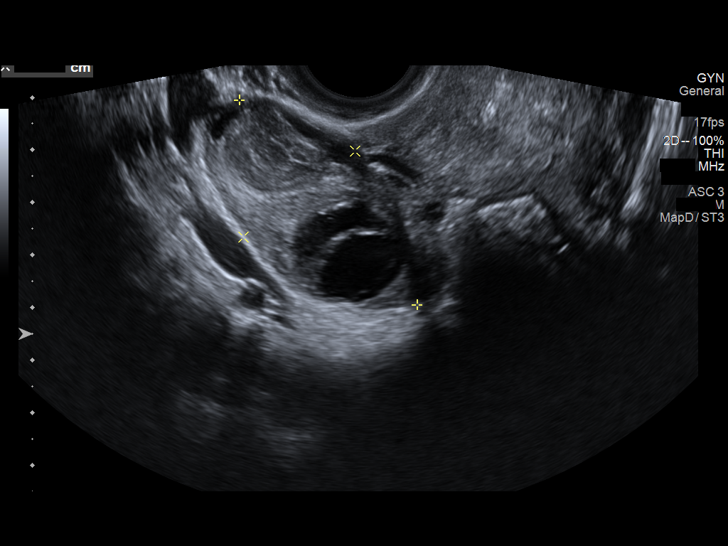
[im 65/78]
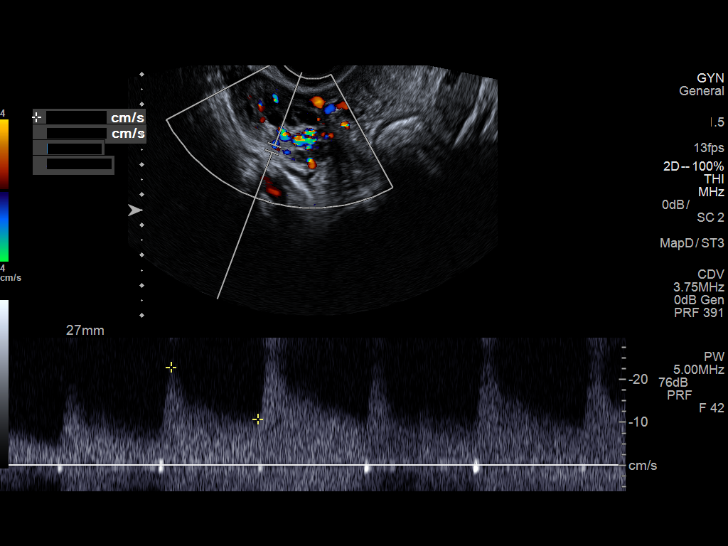
[im 71/78]
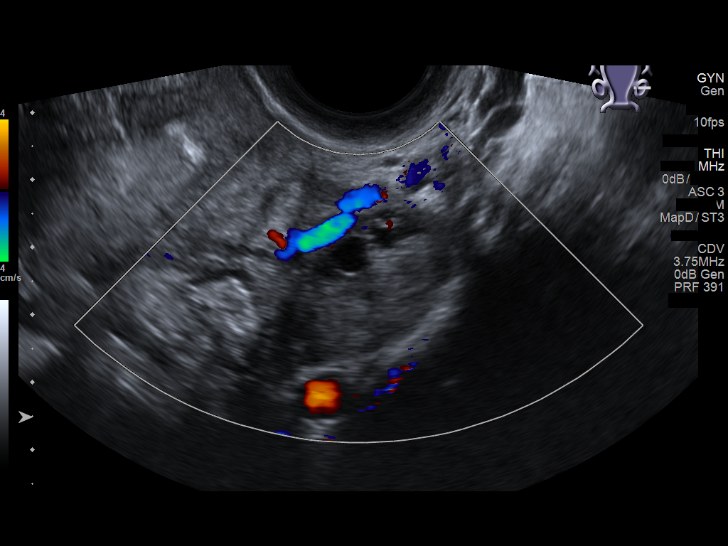
[im 78/78]
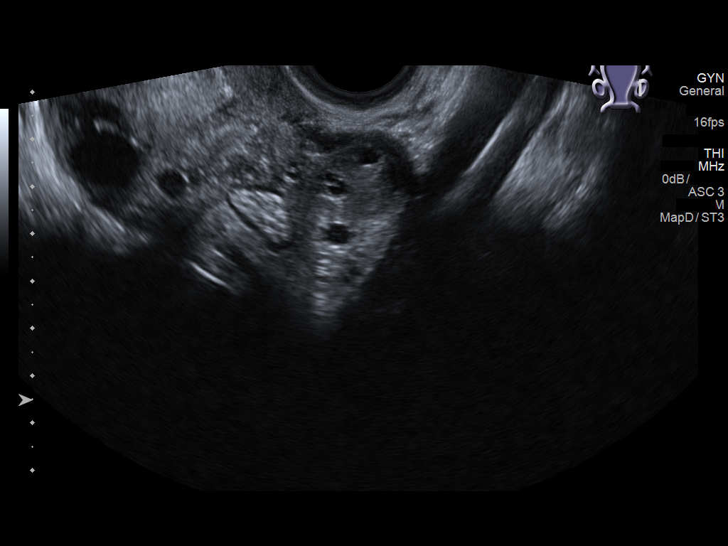

[13 of 25 positions shown; findings below may reference images not displayed]

FINDINGS: Uterus

Measurements: 7.9 x 4.4 x 6 cm. Uterus is anteverted. No fibroids or
other mass visualized.

Endometrium

Thickness: 10 mm.  No focal abnormality visualized.

Right ovary

Measurements: 5.2 x 2.7 x 3.2 cm. Septated cyst measuring 2.2 x 2 x
2 cm, likely representing a functional cyst. No abnormal adnexal
masses.

Left ovary

Measurements: 3.4 x 2 x 2.3 cm.  Normal appearance/no adnexal mass.

Pulsed Doppler evaluation of both ovaries demonstrates normal
low-resistance arterial and venous waveforms.

Other findings

Small amount of free fluid in the pelvis, likely physiologic.
IMPRESSION: Benign-appearing cyst in the right ovary with small amount of free
fluid in the pelvis, likely physiologic. No abnormal adnexal masses.

## 2021-10-04 ENCOUNTER — Encounter (HOSPITAL_COMMUNITY): Payer: Self-pay | Admitting: Emergency Medicine

## 2021-10-04 ENCOUNTER — Emergency Department (HOSPITAL_COMMUNITY)
Admission: EM | Admit: 2021-10-04 | Discharge: 2021-10-04 | Discharge status: Against Medical Advice | Payer: Medicaid Other | Attending: Emergency Medicine | Admitting: Emergency Medicine

## 2021-10-04 ENCOUNTER — Ambulatory Visit (HOSPITAL_COMMUNITY)
Admission: EM | Admit: 2021-10-04 | Discharge: 2021-10-04 | Disposition: A | Discharge status: Other Acute Inpatient Hospital | Payer: Medicaid Other

## 2021-10-04 ENCOUNTER — Emergency Department (HOSPITAL_BASED_OUTPATIENT_CLINIC_OR_DEPARTMENT_OTHER)
Admission: EM | Admit: 2021-10-04 | Discharge: 2021-10-04 | Disposition: A | Discharge status: Home / Self Care | Payer: Medicaid Other | Source: Home / Self Care | Attending: Emergency Medicine | Admitting: Emergency Medicine

## 2021-10-04 ENCOUNTER — Emergency Department (HOSPITAL_COMMUNITY): Payer: Medicaid Other

## 2021-10-04 ENCOUNTER — Other Ambulatory Visit: Payer: Self-pay

## 2021-10-04 ENCOUNTER — Encounter (HOSPITAL_BASED_OUTPATIENT_CLINIC_OR_DEPARTMENT_OTHER): Payer: Self-pay

## 2021-10-04 DIAGNOSIS — Y9389 Activity, other specified: Secondary | ICD-10-CM | POA: Insufficient documentation

## 2021-10-04 DIAGNOSIS — S61411A Laceration without foreign body of right hand, initial encounter: Secondary | ICD-10-CM | POA: Insufficient documentation

## 2021-10-04 DIAGNOSIS — Z23 Encounter for immunization: Secondary | ICD-10-CM | POA: Insufficient documentation

## 2021-10-04 DIAGNOSIS — W25XXXA Contact with sharp glass, initial encounter: Secondary | ICD-10-CM | POA: Insufficient documentation

## 2021-10-04 LAB — CBC
HCT: 40.4 % (ref 36.0–46.0)
Hemoglobin: 13.5 g/dL (ref 12.0–15.0)
MCH: 29.3 pg (ref 26.0–34.0)
MCHC: 33.4 g/dL (ref 30.0–36.0)
MCV: 87.8 fL (ref 80.0–100.0)
Platelets: 215 10*3/uL (ref 150–400)
RBC: 4.6 MIL/uL (ref 3.87–5.11)
RDW: 13 % (ref 11.5–15.5)
WBC: 5.2 10*3/uL (ref 4.0–10.5)
nRBC: 0 % (ref 0.0–0.2)

## 2021-10-04 MED ORDER — TETANUS-DIPHTH-ACELL PERTUSSIS 5-2.5-18.5 LF-MCG/0.5 IM SUSY
0.5000 mL | PREFILLED_SYRINGE | Freq: Once | INTRAMUSCULAR | Status: AC
  Administered 2021-10-04: 0.5 mL via INTRAMUSCULAR
  Filled 2021-10-04: qty 0.5

## 2021-10-04 MED ORDER — LIDOCAINE-EPINEPHRINE 2 %-1:200000 IJ SOLN
20.0000 mL | Freq: Once | INTRAMUSCULAR | Status: AC
Start: 2021-10-04 — End: 2021-10-04
  Administered 2021-10-04: 20 mL
  Filled 2021-10-04: qty 20

## 2021-10-04 NOTE — ED Provider Notes (Signed)
Pt seen in triage. Cut hand on glass; fire department had bandaged it.  ? ?When pressure bandage removed, she immediately has brisk bleeding. ? ?Suspect venous laceration. I have asked her to proceed to the ER for definitive treatment to control the bleeding. ?  ?Zenia Resides, MD ?10/04/21 1719 ? ?

## 2021-10-04 NOTE — ED Provider Triage Note (Signed)
Emergency Medicine Provider Triage Evaluation Note ? ?Elizabeth Cameron , a 26 y.o. female  was evaluated in triage.  Pt complains of right hand laceration.  Patient states that she was moving boxes earlier, her hand went through a glass window.  Patient states she was seen in urgent care however sent here because "a vessel might need to be tied off".  Patient hand bandaged at this time. ? ?Review of Systems  ?Positive:  ?Negative:  ? ?Physical Exam  ?BP 120/81   Pulse 76   Temp 98.6 ?F (37 ?C) (Oral)   Resp 16   SpO2 99%  ?Gen:   Awake, no distress   ?Resp:  Normal effort  ?MSK:   Moves extremities without difficulty  ?Other:  Patient in has intact sensation. ? ?Medical Decision Making  ?Medically screening exam initiated at 6:05 PM.  Appropriate orders placed.  Elizabeth Cameron was informed that the remainder of the evaluation will be completed by another provider, this initial triage assessment does not replace that evaluation, and the importance of remaining in the ED until their evaluation is complete. ? ? ?  ?Azucena Cecil, PA-C ?10/04/21 1806 ? ?

## 2021-10-04 NOTE — ED Triage Notes (Signed)
Pt was moving furniture and sustained a laceration on her right hand. Initially seen at Urgent Care. Sent by Urgent Care to [facility] ED. Pt got tired of waiting so she came here. ?

## 2021-10-04 NOTE — Discharge Instructions (Signed)
Keep wound clean and dry.  Follow-up with your doctor for suture removal in 7 days.  Return to the ED with new or worsening symptoms ?

## 2021-10-04 NOTE — ED Notes (Signed)
Pt notified staff that she is leaving.  

## 2021-10-04 NOTE — ED Provider Notes (Signed)
?[facility] EMERGENCY DEPARTMENT ?Provider Note ? ? ?CSN: [id number] ?Arrival date & time: 10/04/21  1955 ? ?  ? ?History ? ?Chief Complaint  ?Patient presents with  ? Laceration  ? ? ?Elizabeth Cameron is a 26 y.o. female. ? ?Laceration to right hand from broken glass.  Left [facility] without being seen.  Has some numbness in her fourth and fifth digits but is able to wiggle them.  Unknown last tetanus shot.  Extensive bleeding per urgent care and she was referred to the ED.  Does not take any blood thinners.  No other injuries. ? ?The history is provided by the patient.  ?Laceration ?Associated symptoms: no fever and no rash   ? ?  ? ?Home Medications ?Prior to Admission medications   ?Medication Sig Start Date End Date Taking? Authorizing Provider  ?azithromycin (ZITHROMAX Z-PAK) 250 MG tablet Use as directed ?Patient not taking: Reported on 07/03/2017 03/21/13   Graylon Good, PA-C  ?doxycycline (VIBRAMYCIN) 100 MG capsule Take 100 mg by mouth 2 (two) times daily. 06/28/17   [provider]  ?etodolac (LODINE) 500 MG tablet Take 1 tablet (500 mg total) by mouth 2 (two) times daily. ?Patient not taking: Reported on 07/03/2017 03/21/13   Graylon Good, PA-C  ?guaiFENesin-codeine 100-10 MG/5ML syrup Take 5 mLs by mouth 3 (three) times daily as needed for cough. ?Patient not taking: Reported on 07/03/2017 03/21/13   Graylon Good, PA-C  ?HYDROcodone-acetaminophen (NORCO/VICODIN) 5-325 MG per tablet Take 1 tablet by mouth every 4 (four) hours as needed for pain. ?Patient not taking: Reported on 07/03/2017 03/29/13   Schinlever, Santina Evans, PA-C  ?methylPREDNISolone (MEDROL DOSEPAK) 4 MG tablet follow package directions ?Patient not taking: Reported on 07/03/2017 03/21/13   Graylon Good, PA-C  ?metroNIDAZOLE (FLAGYL) 500 MG tablet Take 1 tablet (500 mg total) by mouth 2 (two) times daily. One po bid x 7 days ?Patient not taking: Reported on 07/03/2017 05/16/17   Dartha Lodge, PA-C  ?ondansetron  (ZOFRAN ODT) 4 MG disintegrating tablet 4mg  ODT q4 hours prn nausea/vomit 05/16/17   14/11/18, PA-C  ?promethazine (PHENERGAN) 25 MG tablet Take 1 tablet (25 mg total) by mouth every 6 (six) hours as needed for nausea or vomiting. 07/03/17   Ward, 07/05/17, DO  ?   ? ?Allergies    ?Penicillins   ? ?Review of Systems   ?Review of Systems  ?Constitutional:  Negative for activity change, appetite change and fever.  ?Respiratory:  Negative for apnea and chest tightness.   ?Cardiovascular:  Negative for chest pain.  ?Gastrointestinal:  Negative for abdominal pain, nausea and vomiting.  ?Genitourinary:  Negative for dysuria and hematuria.  ?Musculoskeletal:  Positive for arthralgias and myalgias.  ?Skin:  Positive for wound. Negative for rash.  ?Neurological:  Negative for dizziness, weakness and headaches.  ? all other systems are negative except as noted in the HPI and PMH.  ? ?Physical Exam ?Updated Vital Signs ?BP 136/84 (BP Location: Left Arm)   Pulse 100   Temp 98.9 ?F (37.2 ?C) (Oral)   Resp 16   Ht 5' (1.524 m)   Wt 79.8 kg   LMP  (Within Weeks)   SpO2 100%   BMI 34.37 kg/m?  ?Physical Exam ?Vitals and nursing note reviewed.  ?Constitutional:   ?   General: She is not in acute distress. ?   Appearance: She is well-developed.  ?HENT:  ?   Head: Normocephalic and atraumatic.  ?   Mouth/Throat:  ?  Pharynx: No oropharyngeal exudate.  ?Eyes:  ?   Conjunctiva/sclera: Conjunctivae normal.  ?   Pupils: Pupils are equal, round, and reactive to light.  ?Neck:  ?   Comments: No meningismus. ?Cardiovascular:  ?   Rate and Rhythm: Normal rate and regular rhythm.  ?   Heart sounds: Normal heart sounds. No murmur heard. ?Pulmonary:  ?   Effort: Pulmonary effort is normal. No respiratory distress.  ?   Breath sounds: Normal breath sounds.  ?Abdominal:  ?   Palpations: Abdomen is soft.  ?   Tenderness: There is no abdominal tenderness. There is no guarding or rebound.  ?Musculoskeletal:     ?   General: Tenderness  and signs of injury present. Normal range of motion.  ?   Cervical back: Normal range of motion and neck supple.  ?   Comments: 2 cm transverse laceration to fourth and fifth MCP area on dorsal right hand.  There is brisk venous oozing but nothing pulsatile.  Flexion and extension intact at MCP, PIP and DIP joints. ?Intact radial pulse  ?Skin: ?   General: Skin is warm.  ?Neurological:  ?   Mental Status: She is alert and oriented to person, place, and time.  ?   Cranial Nerves: No cranial nerve deficit.  ?   Motor: No abnormal muscle tone.  ?   Coordination: Coordination normal.  ?   Comments:  5/5 strength throughout. CN 2-12 intact.Equal grip strength.   ?Psychiatric:     ?   Behavior: Behavior normal.  ? ? ?ED Results / Procedures / Treatments   ?Labs ?(all labs ordered are listed, but only abnormal results are displayed) ?Labs Reviewed - No data to display ? ?EKG ?None ? ?Radiology ?DG Hand Complete Right ? ?Result Date: 10/04/2021 ?CLINICAL DATA:  Right hand laceration sent to rule out foreign body. EXAM: RIGHT HAND - COMPLETE 3+ VIEW COMPARISON:  None. FINDINGS: There is no evidence of fracture or dislocation. There is no evidence of arthropathy or other focal bone abnormality. Mildly radiopaque gauze is seen overlying the lateral aspect of the right hand. Soft tissues are unremarkable. No radiopaque soft tissue foreign bodies are identified. IMPRESSION: No acute osseous abnormality or radiopaque soft tissue foreign bodies. Electronically Signed   By: Aram Candela M.D.   On: 10/04/2021 19:08   ? ?Procedures ?Marland Kitchen.Laceration Repair ? ?Date/Time: 10/04/2021 11:19 PM ?Performed by: Glynn Octave, MD ?Authorized by: Glynn Octave, MD  ? ?Consent:  ?  Consent obtained:  Verbal and emergent situation ?  Consent given by:  Patient ?  Risks, benefits, and alternatives were discussed: yes   ?  Risks discussed:  Infection, need for additional repair, nerve damage, poor wound healing, poor cosmetic result, pain,  retained foreign body and tendon damage ?  Alternatives discussed:  No treatment ?Universal protocol:  ?  Procedure explained and questions answered to patient or proxy's satisfaction: yes   ?  Relevant documents present and verified: yes   ?  Test results available: yes   ?  Imaging studies available: yes   ?  Site/side marked: yes   ?  Patient identity confirmed:  Verbally with patient ?Anesthesia:  ?  Anesthesia method:  Local infiltration ?  Local anesthetic:  Lidocaine 1% WITH epi ?Laceration details:  ?  Location:  Hand ?  Hand location:  R hand, dorsum ?  Length (cm):  2 ?Pre-procedure details:  ?  Preparation:  Patient was prepped and draped in usual sterile fashion  and imaging obtained to evaluate for foreign bodies ?Exploration:  ?  Hemostasis achieved with:  Epinephrine, tied off vessels and direct pressure ?  Imaging obtained: bedside ultrasound   ?  Imaging outcome: foreign body not noted   ?  Wound exploration: wound explored through full range of motion and entire depth of wound visualized   ?  Wound extent: vascular damage   ?  Wound extent: no areolar tissue violation noted, no fascia violation noted and no underlying fracture noted   ?  Contaminated: no   ?Treatment:  ?  Area cleansed with:  Povidone-iodine ?  Amount of cleaning:  Standard ?  Irrigation solution:  Sterile saline ?  Irrigation method:  Pressure wash ?Skin repair:  ?  Repair method:  Sutures ?  Suture size:  5-0 ?  Suture material:  Prolene ?  Suture technique:  Simple interrupted ?  Number of sutures:  5 ?Approximation:  ?  Approximation:  Close ?Repair type:  ?  Repair type:  Complex ?Post-procedure details:  ?  Dressing:  Antibiotic ointment and adhesive bandage ?  Procedure completion:  Tolerated ?Comments:  ?   Figure 8x3 Vicryl sutures placed to control venous oozing  ? ? ?Medications Ordered in ED ?Medications  ?lidocaine-EPINEPHrine (XYLOCAINE W/EPI) 2 %-1:200000 (PF) injection 20 mL (20 mLs Infiltration Given by Other 10/04/21  2301)  ?Tdap (BOOSTRIX) injection 0.5 mL (0.5 mLs Intramuscular Given 10/04/21 2246)  ? ? ?ED Course/ Medical Decision Making/ A&P ?  ?                        ?Medical Decision Making ?Risk ?Prescription drug managemen

## 2021-10-04 NOTE — ED Notes (Signed)
ED Provider at bedside. 

## 2021-10-04 NOTE — ED Triage Notes (Signed)
Pt sent by urgent care for laceration to posterior right hand. Cut on glass window while moving furniture.  Bleeding controlled.   ?

## 2021-10-04 NOTE — ED Notes (Signed)
Pt agreeable with d/c plan as discussed by provider-this nurse has verbally reinforced d/c instructions and provided pt with written copy- pt acknowledges verbal understanding and denies any additional questions, concerns, needs- ambulatory independently at d/c; gait steady.    ?

## 2021-10-04 NOTE — ED Notes (Signed)
Suture cart outside of room at this time  ?

## 2021-10-04 NOTE — ED Triage Notes (Signed)
DR at bed side to assess lac to posterior RT hand. Lac site bleeding after pressure Dsy applied  by [employer]. DR Marlinda Mike referred Pt to ED for repair of lac. ?

## 2021-10-04 NOTE — ED Notes (Signed)
R hand wound care performed prior to deptarture-- Telfa dressing to site after thin layer of bacitracin applied; then covered with stretch gauze bandage and secured with cobane wrap - well tolerated by patient.  PMS remains intact.  ?

## 2021-10-04 NOTE — ED Notes (Addendum)
Pt ambulatory from waiting room; gait steady -- pt still showing to have kerlix dressing wrapped to lower RUE with bloody drainage visible through kerlix.  Pt reports RUE being too painful to remove RUE sleeve on jacket  but also reports numbness to 4th and 5th digits.  Pt now awaits ED provider -- visitor at bedside.  This nurse to monitor for acute changes and maintain plan of care.  ?

## 2022-03-16 LAB — HM PAP SMEAR

## 2022-03-16 LAB — OB RESULTS CONSOLE GC/CHLAMYDIA: Chlamydia: NEGATIVE

## 2022-03-16 LAB — HM HIV SCREENING LAB: HM HIV Screening: NEGATIVE

## 2022-05-24 ENCOUNTER — Other Ambulatory Visit: Payer: Self-pay

## 2022-05-25 ENCOUNTER — Ambulatory Visit (INDEPENDENT_AMBULATORY_CARE_PROVIDER_SITE_OTHER): Payer: Medicaid Other | Admitting: Licensed Clinical Social Worker

## 2022-05-25 ENCOUNTER — Encounter: Payer: Self-pay | Admitting: Obstetrics & Gynecology

## 2022-05-25 ENCOUNTER — Ambulatory Visit (INDEPENDENT_AMBULATORY_CARE_PROVIDER_SITE_OTHER): Payer: Medicaid Other | Admitting: Obstetrics & Gynecology

## 2022-05-25 VITALS — BP 126/77 | HR 80 | Ht 61.0 in | Wt 180.7 lb

## 2022-05-25 DIAGNOSIS — F32A Depression, unspecified: Secondary | ICD-10-CM

## 2022-05-25 DIAGNOSIS — F4321 Adjustment disorder with depressed mood: Secondary | ICD-10-CM

## 2022-05-25 DIAGNOSIS — F419 Anxiety disorder, unspecified: Secondary | ICD-10-CM

## 2022-05-25 DIAGNOSIS — N888 Other specified noninflammatory disorders of cervix uteri: Secondary | ICD-10-CM | POA: Diagnosis not present

## 2022-05-25 NOTE — Progress Notes (Signed)
Patient ID: Elizabeth Cameron, female   DOB: June 28, 1995, 26 y.o.   MRN: [id number]  Chief Complaint  Patient presents with   Gynecologic Exam   New Patient (Initial Visit)    HPI Elizabeth Cameron is a 26 y.o. female.  [id number] Patient's last menstrual period was 05/02/2022. She ss referred by her PCP after her annual exam 03/2022. Her pap was normal and she had negative STD screen. Cervical cysts were noted and she referred for examination. She has spotting while using contraceptive patch. HPI  Past Medical History:  Diagnosis Date   ADHD (attention deficit hyperactivity disorder)    vyvanse, none in a couple of weeks due to running out   Anxiety    Bipolar 1 disorder (HCC)    Depression    Hypertension    takes Procardia 30mg    Varicella    remote history    Past Surgical History:  Procedure Laterality Date   ROOT CANAL      Family History  Problem Relation Age of Onset   Depression Mother        Lorazepam, prestige   Anxiety disorder Mother    Bipolar disorder Mother        seroquel   Hypertension Father    Depression Father    Anxiety disorder Father    Heart disease Father    Hypertension Maternal Grandmother    Diabetes Maternal Grandmother    Hypertension Maternal Grandfather    Diabetes Maternal Grandfather    Schizophrenia Paternal Grandmother    Dementia Paternal Grandmother     Social History Social History   Tobacco Use   Smoking status: Former    Packs/day: 0.10    Years: 0.60    Total pack years: 0.06    Types: Cigarettes   Smokeless tobacco: Never   Tobacco comments:    Gets nauseous when she smokes too much.   Interested in for anxiety, stress  Vaping Use   Vaping Use: Never used  Substance Use Topics   Alcohol use: Yes    Comment: occ   Drug use: Yes    Types: Marijuana    Comment: occasional use.  2 blunts.    Allergies  Allergen Reactions   Penicillins Anaphylaxis   Metronidazole     Other Reaction(s) from  [facility]: Abdominal pain    Current Outpatient Medications  Medication Sig Dispense Refill   ALPRAZolam (XANAX) 1 MG tablet Take 1 mg by mouth 3 (three) times daily as needed.     buPROPion (WELLBUTRIN XL) 300 MG 24 hr tablet Take 300 mg by mouth every morning.     Cholecalciferol (VITAMIN D3) 1.25 MG (50000 UT) CAPS Take 1 capsule by mouth once a week.     lisdexamfetamine (VYVANSE) 30 MG capsule Take 30 mg by mouth daily.     mirtazapine (REMERON) 15 MG tablet Take 7.5 mg by mouth at bedtime.     prazosin (MINIPRESS) 1 MG capsule Take 3 mg by mouth at bedtime.     TWIRLA 120-30 MCG/24HR PTWK Apply 1 patch topically once a week.     azithromycin (ZITHROMAX Z-PAK) 250 MG tablet Use as directed 6 each 0   doxycycline (VIBRAMYCIN) 100 MG capsule Take 100 mg by mouth 2 (two) times daily.  0   etodolac (LODINE) 500 MG tablet Take 1 tablet (500 mg total) by mouth 2 (two) times daily. 30 tablet 1   guaiFENesin-codeine 100-10 MG/5ML syrup Take 5 mLs by mouth 3 (three)  times daily as needed for cough. 120 mL 0   HYDROcodone-acetaminophen (NORCO/VICODIN) 5-325 MG per tablet Take 1 tablet by mouth every 4 (four) hours as needed for pain. 12 tablet 0   methylPREDNISolone (MEDROL DOSEPAK) 4 MG tablet follow package directions 21 tablet 0   metroNIDAZOLE (FLAGYL) 500 MG tablet Take 1 tablet (500 mg total) by mouth 2 (two) times daily. One po bid x 7 days 14 tablet 0   ondansetron (ZOFRAN ODT) 4 MG disintegrating tablet 4mg  ODT q4 hours prn nausea/vomit 8 tablet 0   promethazine (PHENERGAN) 25 MG tablet Take 1 tablet (25 mg total) by mouth every 6 (six) hours as needed for nausea or vomiting. 15 tablet 0   No current facility-administered medications for this visit.    Review of Systems Review of Systems  Constitutional: Negative.   Gastrointestinal: Negative.   Genitourinary:  Positive for vaginal bleeding (intermenstrual spotting). Negative for menstrual problem, pelvic pain and vaginal  discharge.  Psychiatric/Behavioral:  Positive for dysphoric mood.     Blood pressure 126/77, pulse 80, height 5\' 1"  (1.549 m), weight 180 lb 11.2 oz (82 kg), last menstrual period 05/02/2022.  Physical Exam Physical Exam Vitals and nursing note reviewed. Exam conducted with a chaperone present.  Constitutional:      Appearance: Normal appearance.  Cardiovascular:     Rate and Rhythm: Normal rate.  Pulmonary:     Effort: Pulmonary effort is normal.  Genitourinary:    General: Normal vulva.     Exam position: Lithotomy position.     Vagina: Normal. No vaginal discharge.     Cervix: Lesion (nabothian cysts and parous changes, benign) present.     Skin:    General: Skin is warm and dry.  Neurological:     Mental Status: She is alert.  Psychiatric:        Mood and Affect: Mood normal.        Behavior: Behavior normal.     Data Reviewed Pap normal  Assessment Anxiety - Plan: Ambulatory referral to Integrated Behavioral Health  Depression, unspecified depression type - Plan: Ambulatory referral to Integrated Behavioral Health  Grief - Plan: Ambulatory referral to Integrated Behavioral Health  Nabothian gland cyst   Plan Routine Gyn follow-up with benign cervical cysts that need not treatment or specific surveillance Orders Placed This Encounter  Procedures   Ambulatory referral to Integrated Behavioral Health    Referral Priority:   Routine    Referral Type:   Consultation    Referral Reason:   Specialty Services Required    Number of Visits Requested:   1       05/25/2022, 11:04 AM

## 2022-05-25 NOTE — Progress Notes (Signed)
Patient presents as a New Patient for AEX.   Last pap: 03/16/22

## 2022-05-27 NOTE — BH Specialist Note (Signed)
Integrated Behavioral Health Initial In-Person Visit  MRN: [id number] Name: Elizabeth Cameron  Number of Integrated Behavioral Health Clinician visits: 1 Session Start time:   11:00am Session End time: 11:34am Total time in minutes: 34 mins via mychart video   Types of Service: Individual psychotherapy  Interpretor:No. Interpretor Name and Language: none   Warm Hand Off Completed.        Subjective: Elizabeth Cameron is a 26 y.o. female accompanied by n/a Patient was referred by Dr Debroah Loop for grief Patient reports the following symptoms/concerns: crying, anxious and depressed mood, loss of interest, feeling overwhelmed.  Duration of problem: approx two year; Severity of problem: mild  Objective: Mood: Depressed and Affect: Appropriate Risk of harm to self or others: No plan to harm self or others  Life Context: Family and Social: Lives with children and family in [place] School/Work: n/a Self-Care: n/a Life Changes: Grieving death of three family members.   Patient and/or Family's Strengths/Protective Factors: Concrete supports in place (healthy food, safe environments, etc.)  Goals Addressed: Patient will: Reduce symptoms of: depression and grief Increase knowledge and/or ability of: coping skills  Demonstrate ability to: Increase healthy adjustment to current life circumstances and Begin healthy grieving over loss  Progress towards Goals: Ongoing  Interventions: Interventions utilized: Motivational Interviewing and Supportive Counseling  Standardized Assessments completed: PHQ 9  Patient and/or Family Response: Elizabeth Cameron reports depressed and anxious mood as a response to grieving the loss of three relatives in a two year time span.   Assessment: Patient currently experiencing depression.   Patient may benefit from community mental health provider.  Plan: Follow up with behavioral health clinician on : as needed  Behavioral recommendations: Seek grief  counseling, prioritize rest and journal emotional triggers.  Referral(s): Integrated [employer] (In Clinic) "From scale of 1-10, how likely are you to follow plan?":     Gwyndolyn Saxon, LCSW

## 2022-09-12 ENCOUNTER — Encounter: Payer: Self-pay | Admitting: Obstetrics and Gynecology

## 2022-09-12 ENCOUNTER — Ambulatory Visit (INDEPENDENT_AMBULATORY_CARE_PROVIDER_SITE_OTHER): Payer: Medicaid Other | Admitting: Obstetrics and Gynecology

## 2022-09-12 ENCOUNTER — Other Ambulatory Visit (HOSPITAL_COMMUNITY)
Admission: RE | Admit: 2022-09-12 | Discharge: 2022-09-12 | Disposition: A | Discharge status: Home / Self Care | Payer: Medicaid Other | Source: Ambulatory Visit | Attending: Obstetrics and Gynecology | Admitting: Obstetrics and Gynecology

## 2022-09-12 VITALS — BP 128/77 | HR 97 | Ht 60.0 in | Wt 183.0 lb

## 2022-09-12 DIAGNOSIS — R102 Pelvic and perineal pain: Secondary | ICD-10-CM | POA: Diagnosis not present

## 2022-09-12 DIAGNOSIS — Z01419 Encounter for gynecological examination (general) (routine) without abnormal findings: Secondary | ICD-10-CM | POA: Diagnosis not present

## 2022-09-12 MED ORDER — ETONOGESTREL-ETHINYL ESTRADIOL 0.12-0.015 MG/24HR VA RING
VAGINAL_RING | VAGINAL | 12 refills | Status: DC
Start: 2022-09-12 — End: 2023-08-16

## 2022-09-12 NOTE — Addendum Note (Signed)
Addended by: Catalina Antigua on: 09/12/2022 03:26 PM   Modules accepted: Orders

## 2022-09-12 NOTE — Progress Notes (Signed)
Subjective:     Elizabeth Cameron is a 27 y.o. female P3 with LMP 08/29/22 and BMI 35 who is here for a comprehensive physical exam. The patient reports RLQ pain intermittently for the past few months. She is sexually active using patch and condoms for contraception. She denies any abnormal discharge. She denies dysuria. Patient is without any complaints  Past Medical History:  Diagnosis Date   ADHD (attention deficit hyperactivity disorder)    vyvanse, none in a couple of weeks due to running out   Anxiety    Bipolar 1 disorder    Depression    Hypertension    takes Procardia 30mg    Varicella    remote history   Past Surgical History:  Procedure Laterality Date   ROOT CANAL     Family History  Problem Relation Age of Onset   Depression Mother        Lorazepam, prestige   Anxiety disorder Mother    Bipolar disorder Mother        seroquel   Hypertension Father    Depression Father    Anxiety disorder Father    Heart disease Father    Hypertension Maternal Grandmother    Diabetes Maternal Grandmother    Hypertension Maternal Grandfather    Diabetes Maternal Grandfather    Schizophrenia Paternal Grandmother    Dementia Paternal Grandmother     Social History   Socioeconomic History   Marital status: Single    Spouse name: Not on file   Number of children: Not on file   Years of education: Not on file   Highest education level: Not on file  Occupational History   Occupation: student    Comment: rising 11th grade.    Tobacco Use   Smoking status: Former    Packs/day: 0.10    Years: 0.60    Additional pack years: 0.00    Total pack years: 0.06    Types: Cigarettes   Smokeless tobacco: Current   Tobacco comments:    Gets nauseous when she smokes too much.   Interested in Engineer, maintenance (IT) for anxiety, stress  Vaping Use   Vaping Use: Never used  Substance and Sexual Activity   Alcohol use: Not Currently    Comment: occ   Drug use: Yes    Types: Marijuana     Comment: occasional use.  2 blunts.   Sexual activity: Yes    Partners: Male    Birth control/protection: Condom, Patch  Other Topics Concern   Not on file  Social History Narrative   Wants to be a Warden/ranger.  Wants to go college.     Social Determinants of Health   Financial Resource Strain: Not on file  Food Insecurity: Not on file  Transportation Needs: Not on file  Physical Activity: Not on file  Stress: Not on file  Social Connections: Not on file  Intimate Partner Violence: Not on file   Health Maintenance  Topic Date Due   Hepatitis C Screening  Never done   COVID-19 Vaccine (3 - 2023-24 season) 02/04/2022   INFLUENZA VACCINE  01/05/2023   PAP-Cervical Cytology Screening  03/16/2025   PAP SMEAR-Modifier  03/16/2025   DTaP/Tdap/Td (2 - Td or Tdap) 10/05/2031   HIV Screening  Completed   HPV VACCINES  Aged Out       Review of Systems Pertinent items noted in HPI and remainder of comprehensive ROS otherwise negative.   Objective:  Blood pressure 128/77, pulse 97, height 5' (  1.524 m), weight 183 lb (83 kg), last menstrual period 08/29/2022.   GENERAL: Well-developed, well-nourished female in no acute distress.  HEENT: Normocephalic, atraumatic. Sclerae anicteric.  NECK: Supple. Normal thyroid.  LUNGS: Clear to auscultation bilaterally.  HEART: Regular rate and rhythm. BREASTS: Symmetric in size. No palpable masses or lymphadenopathy, skin changes, or nipple drainage. ABDOMEN: Soft, nontender, nondistended. No organomegaly. PELVIC: Normal external female genitalia. Vagina is pink and rugated.  Normal discharge. Normal appearing cervix. Uterus is normal in size. No adnexal mass or tenderness. Chaperone present during the pelvic exam EXTREMITIES: No cyanosis, clubbing, or edema, 2+ distal pulses.     Assessment:    Healthy female exam.      Plan:     Pap smear collected STI screening per patient request Pelvic ultrasound ordered Patient will be  contacted with abnormal results Rx Nuvaring provided See After Visit Summary for Counseling Recommendations

## 2022-09-12 NOTE — Progress Notes (Addendum)
27 y.o GYN presents for AEX/STD screening, Pt wants to know about her cyst

## 2022-09-13 LAB — CERVICOVAGINAL ANCILLARY ONLY
Bacterial Vaginitis (gardnerella): POSITIVE — AB
Candida Glabrata: NEGATIVE
Candida Vaginitis: NEGATIVE
Chlamydia: NEGATIVE
Comment: NEGATIVE
Comment: NEGATIVE
Comment: NEGATIVE
Comment: NEGATIVE
Comment: NEGATIVE
Comment: NORMAL
Neisseria Gonorrhea: NEGATIVE
Trichomonas: NEGATIVE

## 2022-09-13 LAB — HEPATITIS C ANTIBODY: Hep C Virus Ab: NONREACTIVE

## 2022-09-13 LAB — HEPATITIS B SURFACE ANTIGEN: Hepatitis B Surface Ag: NEGATIVE

## 2022-09-13 LAB — RPR: RPR Ser Ql: NONREACTIVE

## 2022-09-13 LAB — HIV ANTIBODY (ROUTINE TESTING W REFLEX): HIV Screen 4th Generation wRfx: NONREACTIVE

## 2022-09-14 MED ORDER — CLINDAMYCIN HCL 300 MG PO CAPS: 300.0000 mg | ORAL_CAPSULE | Freq: Two times a day (BID) | ORAL | 0 refills | Status: DC

## 2022-09-14 NOTE — Addendum Note (Signed)
Addended by: Catalina Antigua on: 09/14/2022 07:20 AM   Modules accepted: Orders

## 2022-09-16 ENCOUNTER — Telehealth: Payer: Self-pay

## 2022-09-16 ENCOUNTER — Ambulatory Visit (HOSPITAL_BASED_OUTPATIENT_CLINIC_OR_DEPARTMENT_OTHER)
Admission: RE | Admit: 2022-09-16 | Discharge: 2022-09-16 | Disposition: A | Discharge status: Home / Self Care | Payer: Medicaid Other | Source: Ambulatory Visit | Attending: Obstetrics and Gynecology | Admitting: Obstetrics and Gynecology

## 2022-09-16 DIAGNOSIS — R102 Pelvic and perineal pain: Secondary | ICD-10-CM | POA: Insufficient documentation

## 2022-09-16 NOTE — Telephone Encounter (Signed)
I connected with  Elizabeth Cameron on 09/16/22 by telephone and verified that I am speaking with the correct person using two identifiers.  Informed patient of cervicovaginal ancillary swab results and informed patient a prescription had been sent to her pharmacy to be picked up.   Patient informed pap smear results have not resulted yet.  No further questions or concerns at this time.    Marland Kitchen

## 2022-09-16 NOTE — Telephone Encounter (Signed)
-----   Message from Peggy Constant, MD sent at 09/14/2022  7:20 AM EDT ----- Please inform patient of BV infection. Rx has been e-prescribed 

## 2022-09-16 NOTE — Telephone Encounter (Signed)
-----   Message from Elizabeth Antigua, MD sent at 09/14/2022  7:20 AM EDT ----- Please inform patient of BV infection. Rx has been e-prescribed

## 2022-09-19 LAB — CYTOLOGY - PAP: Adequacy: ABNORMAL

## 2022-09-20 ENCOUNTER — Telehealth: Payer: Self-pay | Admitting: Emergency Medicine

## 2022-09-20 NOTE — Telephone Encounter (Signed)
Pt informed of results, Rx.   

## 2022-09-22 ENCOUNTER — Telehealth: Payer: Self-pay | Admitting: Emergency Medicine

## 2022-09-22 NOTE — Telephone Encounter (Signed)
Pt informed of results. Chat sent to front desk to schedule repeat pap smear.

## 2022-09-22 NOTE — Telephone Encounter (Signed)
-----   Message from Catalina Antigua, MD sent at 09/20/2022  3:44 PM EDT ----- Please inform patient that pap smear was not diagnostics and needs to be recollected

## 2022-10-03 ENCOUNTER — Other Ambulatory Visit (HOSPITAL_COMMUNITY)
Admission: RE | Admit: 2022-10-03 | Discharge: 2022-10-03 | Disposition: A | Discharge status: Home / Self Care | Payer: Medicaid Other | Source: Ambulatory Visit | Attending: Advanced Practice Midwife | Admitting: Advanced Practice Midwife

## 2022-10-03 ENCOUNTER — Ambulatory Visit (INDEPENDENT_AMBULATORY_CARE_PROVIDER_SITE_OTHER): Payer: Medicaid Other | Admitting: Advanced Practice Midwife

## 2022-10-03 ENCOUNTER — Encounter: Payer: Self-pay | Admitting: Advanced Practice Midwife

## 2022-10-03 VITALS — BP 150/97 | HR 64 | Wt 181.6 lb

## 2022-10-03 DIAGNOSIS — Z23 Encounter for immunization: Secondary | ICD-10-CM

## 2022-10-03 DIAGNOSIS — Z7185 Encounter for immunization safety counseling: Secondary | ICD-10-CM

## 2022-10-03 DIAGNOSIS — Z124 Encounter for screening for malignant neoplasm of cervix: Secondary | ICD-10-CM

## 2022-10-03 DIAGNOSIS — N888 Other specified noninflammatory disorders of cervix uteri: Secondary | ICD-10-CM | POA: Diagnosis not present

## 2022-10-03 NOTE — Progress Notes (Signed)
Pt presents for repeat PAP. No other concerns.

## 2022-10-03 NOTE — Addendum Note (Signed)
Addended by: Jearld Adjutant on: 10/03/2022 04:48 PM   Modules accepted: Orders

## 2022-10-03 NOTE — Progress Notes (Signed)
   GYNECOLOGY PROGRESS NOTE  History:  27 y.o. [id number] presents to [facility] office today for problem gyn visit. She had annual and Pap on 09/12/22 but pap result nondiagnostic with epithelial abnormality.  She denies h/a, dizziness, shortness of breath, n/v, or fever/chills.    The following portions of the patient's history were reviewed and updated as appropriate: allergies, current medications, past family history, past medical history, past social history, past surgical history and problem list.   Health Maintenance Due  Topic Date Due   COVID-19 Vaccine (3 - 2023-24 season) 02/04/2022     Review of Systems:  Pertinent items are noted in HPI.   Objective:  Physical Exam Blood pressure (!) 150/97, pulse 64, weight 181 lb 9.6 oz (82.4 kg), last menstrual period 08/29/2022. VS reviewed, nursing note reviewed,  Constitutional: well developed, well nourished, no distress HEENT: normocephalic CV: normal rate Pulm/chest wall: normal effort Breast Exam: deferred Abdomen: soft Neuro: alert and oriented x 3 Skin: warm, dry Psych: affect normal Pelvic exam: Cervix pink, visually closed, without lesion, scant white creamy discharge, vaginal walls and external genitalia normal   Assessment & Plan:  1. Encounter for screening for cervical cancer  - Cytology - PAP( [place])    2. Nabothian cyst --Large cluster of nabothian cysts on left lower side of cervix, around 8 o'clock position. Friable during Pap. --Pap result with abnormal epithelial cells,  may be due to cysts.  If pap remains nondiagnostic, may need to repeat after cysts treated. --Pt reports they bleed at times and sometimes cause pain. --Consult Dr Alysia Penna, and pt scheduled with [facility] for evaluation and drainage of cysts if appropriate    Return for Schedule problem gyn visit with Dr Alysia Penna to drain Nabothian cysts.   Sharen Counter, CNM 4:44 PM

## 2022-10-05 ENCOUNTER — Ambulatory Visit: Payer: Medicaid Other | Admitting: Obstetrics and Gynecology

## 2022-10-06 LAB — CYTOLOGY - PAP: Diagnosis: NEGATIVE

## 2022-10-11 ENCOUNTER — Ambulatory Visit (INDEPENDENT_AMBULATORY_CARE_PROVIDER_SITE_OTHER): Payer: Medicaid Other | Admitting: Obstetrics and Gynecology

## 2022-10-11 ENCOUNTER — Encounter: Payer: Self-pay | Admitting: Obstetrics and Gynecology

## 2022-10-11 VITALS — BP 134/84 | HR 102 | Wt 178.0 lb

## 2022-10-11 DIAGNOSIS — N888 Other specified noninflammatory disorders of cervix uteri: Secondary | ICD-10-CM | POA: Diagnosis not present

## 2022-10-11 LAB — POCT URINE PREGNANCY: Preg Test, Ur: NEGATIVE

## 2022-10-11 NOTE — Progress Notes (Addendum)
Elizabeth Cameron presents for I & D of cervical nabothian cysts. 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 They have increase in size and are uncomfortable.  Pap smear, UTD  Gyn Procedure  After informed consent obtained. Pt was placed in DL position. Speculum was placed exposing cervix. Four nabothian cysts were noted. The cervix was cleaned with Betadine. Hurricane spray was applied. Each cyts then was stabbed with a # 11 blade. Copious amounts of mucoid fluid was then expressed from each cyst. Monsel's was then applied. Hemostasis was noted. Pt tolerated procedure well. Instructed to use OTC Motrin as needed for discomforted. Reframe from intercourse as long as discharge present  A/P S/P I & D of nabothian cysts  F/U in 4 weeks

## 2022-10-11 NOTE — Addendum Note (Signed)
Addended by: Marya Landry D on: 10/11/2022 04:41 PM   Modules accepted: Orders

## 2022-11-14 ENCOUNTER — Encounter: Payer: Self-pay | Admitting: Obstetrics and Gynecology

## 2022-11-14 ENCOUNTER — Ambulatory Visit (INDEPENDENT_AMBULATORY_CARE_PROVIDER_SITE_OTHER): Payer: Medicaid Other | Admitting: Obstetrics and Gynecology

## 2022-11-14 VITALS — BP 131/83 | HR 105 | Ht 61.0 in | Wt 184.9 lb

## 2022-11-14 DIAGNOSIS — Z0000 Encounter for general adult medical examination without abnormal findings: Secondary | ICD-10-CM | POA: Diagnosis not present

## 2022-11-14 NOTE — Progress Notes (Unsigned)
Pt presents for follow up after I&D of nabothian cyst on 10/11/2022.

## 2022-11-15 NOTE — Progress Notes (Signed)
Elizabeth Cameron presents for follow up from I & D of cervical nabothian cysts. See prior office note Pt has no complaints today.  PE AF VSS Chaperone present Lungs clear Heart RRR Abd soft + BS GU Nl EGBUS, cervix normal appearance, no [place] noted  A/P S/P I & D of cervical nabothian cysts Doing well. F/U PRN

## 2023-08-10 ENCOUNTER — Other Ambulatory Visit: Payer: Self-pay | Admitting: Obstetrics and Gynecology

## 2023-08-10 DIAGNOSIS — Z01419 Encounter for gynecological examination (general) (routine) without abnormal findings: Secondary | ICD-10-CM

## 2023-08-15 ENCOUNTER — Ambulatory Visit
Admission: EM | Admit: 2023-08-15 | Discharge: 2023-08-15 | Disposition: A | Discharge status: Home / Self Care | Attending: Internal Medicine | Admitting: Internal Medicine

## 2023-08-15 ENCOUNTER — Ambulatory Visit: Payer: Self-pay

## 2023-08-15 ENCOUNTER — Encounter: Payer: Self-pay | Admitting: Emergency Medicine

## 2023-08-15 DIAGNOSIS — Z9189 Other specified personal risk factors, not elsewhere classified: Secondary | ICD-10-CM | POA: Diagnosis present

## 2023-08-15 DIAGNOSIS — Z202 Contact with and (suspected) exposure to infections with a predominantly sexual mode of transmission: Secondary | ICD-10-CM | POA: Insufficient documentation

## 2023-08-15 DIAGNOSIS — N760 Acute vaginitis: Secondary | ICD-10-CM | POA: Insufficient documentation

## 2023-08-15 DIAGNOSIS — T3695XA Adverse effect of unspecified systemic antibiotic, initial encounter: Secondary | ICD-10-CM | POA: Diagnosis present

## 2023-08-15 DIAGNOSIS — B379 Candidiasis, unspecified: Secondary | ICD-10-CM | POA: Diagnosis present

## 2023-08-15 DIAGNOSIS — R0602 Shortness of breath: Secondary | ICD-10-CM | POA: Diagnosis present

## 2023-08-15 DIAGNOSIS — J209 Acute bronchitis, unspecified: Secondary | ICD-10-CM | POA: Insufficient documentation

## 2023-08-15 LAB — POCT URINE PREGNANCY: Preg Test, Ur: NEGATIVE

## 2023-08-15 MED ORDER — IPRATROPIUM-ALBUTEROL 0.5-2.5 (3) MG/3ML IN SOLN
3.0000 mL | Freq: Once | RESPIRATORY_TRACT | Status: AC
  Administered 2023-08-15: 3 mL via RESPIRATORY_TRACT

## 2023-08-15 MED ORDER — ALBUTEROL SULFATE HFA 108 (90 BASE) MCG/ACT IN AERS: 1.0000 | INHALATION_SPRAY | Freq: Four times a day (QID) | RESPIRATORY_TRACT | 0 refills | Status: AC | PRN

## 2023-08-15 MED ORDER — TINIDAZOLE 500 MG PO TABS: 2.0000 g | ORAL_TABLET | Freq: Once | ORAL | 0 refills | Status: AC

## 2023-08-15 MED ORDER — FLUCONAZOLE 150 MG PO TABS: 150.0000 mg | ORAL_TABLET | ORAL | 0 refills | Status: DC

## 2023-08-15 MED ORDER — PREDNISONE 20 MG PO TABS: 40.0000 mg | ORAL_TABLET | Freq: Every day | ORAL | 0 refills | Status: AC

## 2023-08-15 NOTE — ED Triage Notes (Signed)
 Pt c/o cough for 2 weeks. Pt also has had vaginal discomfort, discharge, and abdominal cramps for 1 week. Needs std testing

## 2023-08-15 NOTE — ED Provider Notes (Signed)
 [facility]    CSN: [id number] Arrival date & time: 08/15/23  1903      History   Chief Complaint Chief Complaint  Patient presents with   Cough   SEXUALLY TRANSMITTED DISEASE    HPI Elizabeth Cameron is a 28 y.o. female.   Elizabeth Cameron is a 28 y.o. female presenting for chief complaint of cough that started 2 weeks ago. Cough is productive with green/yellow sputum. She denies recent fevers, chills, N/V/D, chest pain, palpitations, abdominal pain, and rash. Reports intermittent shortness of breath associated with cough. History of bronchitis, smokes marijuana daily, denies history of asthma/copd.   Would like testing for STDs Boyfriend tested positive for  trichmonosas and is currently reiceiving treatment Vaginal discharge cloudy/white No odor, reports slight itch No urinary symptoms   Cough   Past Medical History:  Diagnosis Date   ADHD (attention deficit hyperactivity disorder)    vyvanse, none in a couple of weeks due to running out   Anxiety    Bipolar 1 disorder (HCC)    Depression    Hypertension    takes Procardia 30mg    Varicella    remote history    Patient Active Problem List   Diagnosis Date Noted   Normal genital exam 11/14/2022   HPV vaccine counseling 10/03/2022   Oppositional defiant disorder 12/10/2011   Bipolar affective disorder, mixed, severe (HCC) 12/09/2011   Cannabis abuse 12/09/2011   Attention deficit hyperactivity disorder, 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 Authorizing Provider  albuterol (VENTOLIN HFA) 108 (90 Base) MCG/ACT inhaler Inhale 1-2 puffs into the lungs every 6  (six) hours as needed for wheezing or shortness of breath. 08/15/23  Yes Carlisle Beers, FNP  fluconazole (DIFLUCAN) 150 MG tablet Take 1 tablet (150 mg total) by mouth every 3 (three) days. 08/15/23  Yes Carlisle Beers, FNP  predniSONE (DELTASONE) 20 MG tablet Take 2 tablets (40 mg total) by mouth daily with breakfast for 5 days. 08/15/23 08/20/23 Yes Refujio Haymer, Donavan Burnet, FNP  clindamycin (CLEOCIN) 300 MG capsule Take 1 capsule (300 mg total) by mouth 2 (two) times daily. For seven days Patient not taking: Reported on 11/14/2022 09/14/22   Constant, Peggy, MD  etonogestrel-ethinyl estradiol (NUVARING) 0.12-0.015 MG/24HR vaginal ring Insert vaginally and leave in place for 3 consecutive weeks, then remove for 1 week. 08/16/23   Constant, Peggy, MD  metroNIDAZOLE (FLAGYL) 500 MG tablet Take 4 tablets (2,000 mg total) by mouth once for 1 dose. 08/16/23 08/16/23  Zenia Resides, MD    Family History Family History  Problem Relation Age of Onset   Depression Mother        Lorazepam, prestige   Anxiety disorder Mother    Bipolar disorder Mother        seroquel   Hypertension Father    Depression Father    Anxiety disorder Father  Heart disease Father    Hypertension Maternal Grandmother    Diabetes Maternal Grandmother    Hypertension Maternal Grandfather    Diabetes Maternal Grandfather    Schizophrenia Paternal Grandmother    Dementia Paternal Grandmother     Social History Social History   Tobacco Use   Smoking status: Former    Current packs/day: 0.10    Average packs/day: 0.1 packs/day for 0.6 years (0.1 ttl pk-yrs)    Types: Cigarettes    Passive exposure: Never   Smokeless tobacco: Current   Tobacco comments:    Gets nauseous when she smokes too much.   Interested in Engineer, maintenance (IT) for anxiety, stress  Vaping Use   Vaping status: Never Used  Substance Use Topics   Alcohol use: Not Currently    Comment: occ   Drug use: Yes    Types: Marijuana     Comment: occasional use.  2 blunts.     Allergies   Penicillins and Metronidazole   Review of Systems Review of Systems  Respiratory:  Positive for cough.      Physical Exam Triage Vital Signs ED Triage Vitals  Encounter Vitals Group     BP 08/15/23 1907 134/85     Systolic BP Percentile --      Diastolic BP Percentile --      Pulse Rate 08/15/23 1907 88     Resp 08/15/23 1907 17     Temp 08/15/23 1907 98.5 F (36.9 C)     Temp Source 08/15/23 1907 Oral     SpO2 08/15/23 1907 95 %     Weight --      Height --      Head Circumference --      Peak Flow --      Pain Score 08/15/23 1911 7     Pain Loc --      Pain Education --      Exclude from Growth Chart --    No data found.  Updated Vital Signs BP 134/85 (BP Location: Right Arm)   Pulse 88   Temp 98.5 F (36.9 C) (Oral)   Resp 17   LMP 07/19/2023 (Approximate)   SpO2 95%   Visual Acuity Right Eye Distance:   Left Eye Distance:   Bilateral Distance:    Right Eye Near:   Left Eye Near:    Bilateral Near:     Physical Exam Vitals and nursing note reviewed.  Constitutional:      Appearance: She is not ill-appearing or toxic-appearing.  HENT:     Head: Normocephalic and atraumatic.     Right Ear: Hearing, tympanic membrane, ear canal and external ear normal.     Left Ear: Hearing, tympanic membrane, ear canal and external ear normal.     Nose: Congestion present.     Mouth/Throat:     Lips: Pink.     Mouth: Mucous membranes are moist. No injury or oral lesions.     Dentition: Normal dentition.     Tongue: No lesions.     Pharynx: Oropharynx is clear. Uvula midline. No pharyngeal swelling, oropharyngeal exudate, posterior oropharyngeal erythema, uvula swelling or postnasal drip.     Tonsils: No tonsillar exudate.  Eyes:     General: Lids are normal. Vision grossly intact. Gaze aligned appropriately.     Extraocular Movements: Extraocular movements intact.     Conjunctiva/sclera: Conjunctivae  normal.  Neck:     Trachea: Trachea and phonation normal.  Cardiovascular:  Rate and Rhythm: Normal rate and regular rhythm.     Heart sounds: Normal heart sounds, S1 normal and S2 normal.  Pulmonary:     Effort: Pulmonary effort is normal. No respiratory distress.     Breath sounds: Normal air entry. Rhonchi present. No wheezing or rales.     Comments: Rhonchi heard to all lung fields bilaterally. Speaks in full sentences without difficulty or increased work of breathing.  Chest:     Chest wall: No tenderness.  Musculoskeletal:     Cervical back: Neck supple.  Lymphadenopathy:     Cervical: No cervical adenopathy.  Skin:    General: Skin is warm and dry.     Capillary Refill: Capillary refill takes less than 2 seconds.     Findings: No rash.  Neurological:     General: No focal deficit present.     Mental Status: She is alert and oriented to person, place, and time. Mental status is at baseline.     Cranial Nerves: No dysarthria or facial asymmetry.  Psychiatric:        Mood and Affect: Mood normal.        Speech: Speech normal.        Behavior: Behavior normal.        Thought Content: Thought content normal.        Judgment: Judgment normal.      UC Treatments / Results  Labs (all labs ordered are listed, but only abnormal results are displayed) Labs Reviewed  RPR  HIV ANTIBODY (ROUTINE TESTING W REFLEX)  POCT URINE PREGNANCY  CERVICOVAGINAL ANCILLARY ONLY    EKG   Radiology No results found.  Procedures Procedures (including critical care time)  Medications Ordered in UC Medications  ipratropium-albuterol (DUONEB) 0.5-2.5 (3) MG/3ML nebulizer solution 3 mL (3 mLs Nebulization Given 08/15/23 1955)    Initial Impression / Assessment and Plan / UC Course  I have reviewed the triage vital signs and the nursing notes.  Pertinent labs & imaging results that were available during my care of the patient were reviewed by me and considered in my medical  decision making (see chart for details).   1. Acute bronchitis, shortness of breath Evaluation suggests viral bronchitis.  Duoneb nebulizer treatment given in clinic with significant improvement in lung sounds and shortness of breath on reassessment. Clear and non-focal lung exam on re-assessment, therefore deferred imaging of the chest wall.   Recommend treatment with steroid, bronchodilator, cough suppressants for symptomatic relief, and expectorants (mucinex) as needed- see AVS.   2. At risk for STD due to unprotected sex, exposure to trichomonas, acute vaginitis, antibiotic induced yeast infection STD panel pending including HIV/RPR.  Empiric treatment for trichomonas ordered, patient additionally requests empiric treatment for acute yeast vaginitis associated with antibiotic use.  She is allergic to flagyl (abdominal pain, states she "didn't like the way it made her feel").  Per UTD guidelines,   Per UTD, Tinidazole is an effective alternative to treat trichomonas in the setting of sensitivity to flagyl due to lower GI side effects.  Patient is open to trying a one time dose of Tinidazole.  Will treat for other positive infections per protocol once results come back.  Safe sex precautions and condom use discussed. Urine pregnancy test negative in clinic.   Counseled patient on potential for adverse effects with medications prescribed/recommended today, strict ER and return-to-clinic precautions discussed, patient verbalized understanding.    Final Clinical Impressions(s) / UC Diagnoses   Final diagnoses:  At  risk for sexually transmitted disease due to unprotected sex  Acute vaginitis  Exposure to trichomonas  Acute bronchitis, unspecified organism  Antibiotic-induced yeast infection  Shortness of breath     Discharge Instructions      You have bronchitis which is inflammation of the upper airways in your lungs due to a virus.   We will treat this with steroids. Do not  take any NSAIDs with steroid pills (no ibuprofen, naproxen while taking steroid, this could cause stomach upset).   Use albuterol every 4-6 hours as needed for cough, shortness of breath, and wheezing.   Use guaifenesin (plain mucinex) to break up congestion in nose/chest so that you are able to excrete easier. Drink plenty of fluids to stay well hydrated while taking mucinex so that it works well in the body.   Take tinadazole 2g once to treat trichomonas due to recent exposure. Take diflucan today, then again in 3 days to treat antibiotic induce yeast vaginitis.  If you develop any new or worsening symptoms or if your symptoms do not start to improve, please return here or follow-up with your primary care provider. If your symptoms are severe, please go to the emergency room.     ED Prescriptions     Medication Sig Dispense Auth. Provider   tinidazole (TINDAMAX) 500 MG tablet Take 4 tablets (2,000 mg total) by mouth once for 1 dose. 4 tablet Reita May M, FNP   predniSONE (DELTASONE) 20 MG tablet Take 2 tablets (40 mg total) by mouth daily with breakfast for 5 days. 10 tablet Carlisle Beers, FNP   fluconazole (DIFLUCAN) 150 MG tablet Take 1 tablet (150 mg total) by mouth every 3 (three) days. 2 tablet Carlisle Beers, FNP   albuterol (VENTOLIN HFA) 108 (90 Base) MCG/ACT inhaler Inhale 1-2 puffs into the lungs every 6 (six) hours as needed for wheezing or shortness of breath. 18 g Carlisle Beers, FNP      PDMP not reviewed this encounter.   Carlisle Beers, [place] 08/16/23 2106

## 2023-08-15 NOTE — Discharge Instructions (Addendum)
 You have bronchitis which is inflammation of the upper airways in your lungs due to a virus.   We will treat this with steroids. Do not take any NSAIDs with steroid pills (no ibuprofen, naproxen while taking steroid, this could cause stomach upset).   Use albuterol every 4-6 hours as needed for cough, shortness of breath, and wheezing.   Use guaifenesin (plain mucinex) to break up congestion in nose/chest so that you are able to excrete easier. Drink plenty of fluids to stay well hydrated while taking mucinex so that it works well in the body.   Take tinadazole 2g once to treat trichomonas due to recent exposure. Take diflucan today, then again in 3 days to treat antibiotic induce yeast vaginitis.  If you develop any new or worsening symptoms or if your symptoms do not start to improve, please return here or follow-up with your primary care provider. If your symptoms are severe, please go to the emergency room.

## 2023-08-16 ENCOUNTER — Other Ambulatory Visit: Payer: Self-pay

## 2023-08-16 ENCOUNTER — Telehealth: Payer: Self-pay

## 2023-08-16 DIAGNOSIS — Z01419 Encounter for gynecological examination (general) (routine) without abnormal findings: Secondary | ICD-10-CM

## 2023-08-16 LAB — CERVICOVAGINAL ANCILLARY ONLY
Bacterial Vaginitis (gardnerella): NEGATIVE
Candida Glabrata: NEGATIVE
Candida Vaginitis: NEGATIVE
Chlamydia: NEGATIVE
Comment: NEGATIVE
Comment: NEGATIVE
Comment: NEGATIVE
Comment: NEGATIVE
Comment: NEGATIVE
Comment: NORMAL
Neisseria Gonorrhea: NEGATIVE
Trichomonas: NEGATIVE

## 2023-08-16 MED ORDER — METRONIDAZOLE 500 MG PO TABS: 2000.0000 mg | ORAL_TABLET | Freq: Once | ORAL | 0 refills | Status: AC

## 2023-08-16 MED ORDER — ETONOGESTREL-ETHINYL ESTRADIOL 0.12-0.015 MG/24HR VA RING: VAGINAL_RING | VAGINAL | 0 refills | Status: DC

## 2023-08-16 NOTE — Telephone Encounter (Signed)
 This RN received pt's phone call at this time. Name and DOB verified. Pt was seen here yesterday 3/11 and wanted clarification on her test results from cytology swab. This RN reviewed pt's chart and cytology swab was negative. This RN reviewed this information with patient. Pt verbalized understanding. RN educated patient to pick up prescriptions at pharmacy that was called in by [facility] NP yesterday. No further questions/concerns at this time.

## 2023-08-16 NOTE — Telephone Encounter (Signed)
 Pt was prescribed Tinidazole for treatment of Trichomonas d/t previous issues taking metronidazole. Pt called stating her insurance is not approving the Tinidazole, and asked if there is a suppository alternative. Advised the only protocol treatments for BV are metronidazole 500mg  PO BID x7 days, metronidazole 2g PO once, or tinidazole 2g PO once. Pt requested 2g metronidazole. States she only has abd pain with metronidazole. Denies any airway involvement, hives, uncontrolled n/v. Reviewed with patient, verified pharmacy, prescription sent

## 2023-08-16 NOTE — Progress Notes (Signed)
 1 BC refill sent until annual on 09/06/23

## 2023-08-17 LAB — HIV ANTIBODY (ROUTINE TESTING W REFLEX): HIV Screen 4th Generation wRfx: NONREACTIVE

## 2023-08-17 LAB — RPR: RPR Ser Ql: NONREACTIVE

## 2023-09-06 ENCOUNTER — Ambulatory Visit

## 2023-09-06 ENCOUNTER — Other Ambulatory Visit (HOSPITAL_COMMUNITY)
Admission: RE | Admit: 2023-09-06 | Discharge: 2023-09-06 | Disposition: A | Discharge status: Home / Self Care | Source: Ambulatory Visit | Attending: Medical | Admitting: Medical

## 2023-09-06 VITALS — BP 130/77 | HR 95 | Ht 62.0 in | Wt 203.1 lb

## 2023-09-06 DIAGNOSIS — Z113 Encounter for screening for infections with a predominantly sexual mode of transmission: Secondary | ICD-10-CM

## 2023-09-06 DIAGNOSIS — Z01419 Encounter for gynecological examination (general) (routine) without abnormal findings: Secondary | ICD-10-CM

## 2023-09-06 MED ORDER — ETONOGESTREL-ETHINYL ESTRADIOL 0.12-0.015 MG/24HR VA RING: VAGINAL_RING | VAGINAL | 0 refills | Status: DC

## 2023-09-06 NOTE — Progress Notes (Signed)
 SUBJECTIVE:  28 y.o. female who desires a STI screen. Denies abnormal vaginal discharge, bleeding or significant pelvic pain. No UTI symptoms. Denies history of known exposure to STD.  Patient's last menstrual period was 07/19/2023 (approximate).  OBJECTIVE:  She appears well.   ASSESSMENT:  STI Screen   PLAN:  Pt offered STI blood screening-not indicated GC, chlamydia, and trichomonas probe sent to lab.  Treatment: To be determined once lab results are received.  Pt follow up as needed.

## 2023-09-07 ENCOUNTER — Encounter: Payer: Self-pay | Admitting: Obstetrics and Gynecology

## 2023-09-07 LAB — CERVICOVAGINAL ANCILLARY ONLY
Bacterial Vaginitis (gardnerella): NEGATIVE
Candida Glabrata: NEGATIVE
Candida Vaginitis: NEGATIVE
Chlamydia: NEGATIVE
Comment: NEGATIVE
Comment: NEGATIVE
Comment: NEGATIVE
Comment: NEGATIVE
Comment: NEGATIVE
Comment: NORMAL
Neisseria Gonorrhea: NEGATIVE
Trichomonas: NEGATIVE

## 2023-09-12 ENCOUNTER — Other Ambulatory Visit (HOSPITAL_COMMUNITY)
Admission: RE | Admit: 2023-09-12 | Discharge: 2023-09-12 | Disposition: A | Discharge status: Home / Self Care | Source: Ambulatory Visit | Attending: Obstetrics and Gynecology | Admitting: Obstetrics and Gynecology

## 2023-09-12 ENCOUNTER — Ambulatory Visit (INDEPENDENT_AMBULATORY_CARE_PROVIDER_SITE_OTHER): Admitting: *Deleted

## 2023-09-12 VITALS — BP 133/76 | HR 89

## 2023-09-12 DIAGNOSIS — N898 Other specified noninflammatory disorders of vagina: Secondary | ICD-10-CM

## 2023-09-12 NOTE — Progress Notes (Signed)
 SUBJECTIVE:  28 y.o. female complains of yellow vaginal discharge for 1 week(s). Denies abnormal vaginal bleeding or significant pelvic pain or fever. No UTI symptoms. Denies history of known exposure to STD.  Patient's last menstrual period was 07/19/2023 (approximate).  OBJECTIVE:  She appears well, afebrile. Urine dipstick: not done.  ASSESSMENT:  Vaginal Discharge  Vaginal Odor   PLAN:  GC, chlamydia, trichomonas, BVAG, CVAG probe sent to lab. Treatment: To be determined once lab results are received ROV prn if symptoms persist or worsen.

## 2023-09-13 ENCOUNTER — Encounter: Payer: Self-pay | Admitting: Obstetrics and Gynecology

## 2023-09-13 LAB — CERVICOVAGINAL ANCILLARY ONLY
Bacterial Vaginitis (gardnerella): NEGATIVE
Candida Glabrata: NEGATIVE
Candida Vaginitis: NEGATIVE
Chlamydia: NEGATIVE
Comment: NEGATIVE
Comment: NEGATIVE
Comment: NEGATIVE
Comment: NEGATIVE
Comment: NEGATIVE
Comment: NORMAL
Neisseria Gonorrhea: NEGATIVE
Trichomonas: NEGATIVE

## 2023-09-15 ENCOUNTER — Other Ambulatory Visit: Payer: Self-pay | Admitting: Family

## 2023-09-15 ENCOUNTER — Ambulatory Visit
Admission: RE | Admit: 2023-09-15 | Discharge: 2023-09-15 | Disposition: A | Discharge status: Home / Self Care | Source: Ambulatory Visit | Attending: Family | Admitting: Family

## 2023-09-15 DIAGNOSIS — M25511 Pain in right shoulder: Secondary | ICD-10-CM

## 2023-09-15 DIAGNOSIS — G8929 Other chronic pain: Secondary | ICD-10-CM

## 2023-09-20 ENCOUNTER — Ambulatory Visit: Admitting: Physician Assistant

## 2023-10-03 NOTE — Progress Notes (Unsigned)
 ANNUAL EXAM Patient name: Elizabeth Cameron MRN [id number]  Date of birth: [birth year] Chief Complaint:   No chief complaint on file.  History of Present Illness:   Elizabeth Cameron is a 29 y.o. [phone] {race:25618} female being seen today for a routine annual exam.  Current complaints: ***  Patient's last menstrual period was 07/19/2023 (approximate).   The pregnancy intention screening data noted above was reviewed. Potential methods of contraception were discussed. The patient elected to proceed with No data recorded.   Last pap 10/03/22 NILM. H/O abnormal pap: {yes/yes***/no:23866} Last mammogram: ***. Results were: {normal, abnormal, n/a:23837}. Family h/o breast cancer: {yes***/no:23838} Last colonoscopy: ***. Results were: {normal, abnormal, n/a:23837}. Family h/o colorectal cancer: {yes***/no:23838}     05/25/2022   10:39 AM  Depression screen PHQ 2/9  Decreased Interest 0  Down, Depressed, Hopeless 0  PHQ - 2 Score 0  Altered sleeping 0  Tired, decreased energy 0  Change in appetite 1  Feeling bad or failure about yourself  1  Trouble concentrating 1  Moving slowly or fidgety/restless 1  Suicidal thoughts 0  PHQ-9 Score 4        05/25/2022   10:41 AM  GAD 7 : Generalized Anxiety Score  Nervous, Anxious, on Edge 1  Control/stop worrying 3  Worry too much - different things 3  Trouble relaxing 2  Restless 1  Easily annoyed or irritable 2  Afraid - awful might happen 1  Total GAD 7 Score 13     Review of Systems:   Pertinent items are noted in HPI Denies any headaches, blurred vision, fatigue, shortness of breath, chest pain, abdominal pain, abnormal vaginal discharge/itching/odor/irritation, problems with periods, bowel movements, urination, or intercourse unless otherwise stated above. Pertinent History Reviewed:  Reviewed past medical,surgical, social and family history.  Reviewed problem list, medications and allergies. Physical Assessment:  There were  no vitals filed for this visit.There is no height or weight on file to calculate BMI.        Physical Examination:   General appearance - well appearing, and in no distress  Mental status - alert, oriented to person, place, and time  Psych:  She has a normal mood and affect  Skin - warm and dry, normal color, no suspicious lesions noted  Chest - effort normal, all lung fields clear to auscultation bilaterally  Heart - normal rate and regular rhythm  Neck:  midline trachea, no thyromegaly or nodules  Breasts - breasts appear normal, no suspicious masses, no skin or nipple changes or  axillary nodes  Abdomen - soft, nontender, nondistended, no masses or organomegaly  Pelvic - VULVA: normal appearing vulva with no masses, tenderness or lesions  VAGINA: normal appearing vagina with normal color and discharge, no lesions  CERVIX: normal appearing cervix without discharge or lesions, no CMT  Thin prep pap is {Desc; done/not:10129} *** HR HPV cotesting  UTERUS: uterus is felt to be normal size, shape, consistency and nontender   ADNEXA: No adnexal masses or tenderness noted.  Extremities:  No swelling or varicosities noted  Chaperone present for exam  No results found for this or any previous visit (from the past 24 hours).  Assessment & Plan:  - Cervical cancer screening: Discussed screening Q3 years. Reviewed importance of annual exams and limits of pap smear. Pap with reflex HPV *** - GC/CT: Discussed and recommended. Pt  {Blank single:19197::"accepts","declines"} - Gardasil: {Blank single:19197::"***","has not yet had. Will provide information","completed","has not yet had. Counseling provided and she declines","Has not  yet had. Counseling provided and pt accepts"} - Birth Control: {Birth control type:23956} - Breast Health: Encouraged self breast awareness/exams. Teaching provided. - Mammogram: {Mammo f/u:25212::"@ 28yo"}, or sooner if problems - Colonoscopy: {TCS f/u:25213::"@ 28yo"}, or  sooner if problems - Follow-up: 12 months and prn   No orders of the defined types were placed in this encounter.   Meds: No orders of the defined types were placed in this encounter.   Follow-up: No follow-ups on file.  Lendell Gallick E Jobie Popp, [place] 10/03/2023 3:31 PM

## 2023-10-04 ENCOUNTER — Other Ambulatory Visit (HOSPITAL_COMMUNITY)
Admission: RE | Admit: 2023-10-04 | Discharge: 2023-10-04 | Disposition: A | Discharge status: Home / Self Care | Source: Ambulatory Visit | Attending: Physician Assistant | Admitting: Physician Assistant

## 2023-10-04 ENCOUNTER — Ambulatory Visit (INDEPENDENT_AMBULATORY_CARE_PROVIDER_SITE_OTHER): Admitting: Physician Assistant

## 2023-10-04 VITALS — BP 125/73 | HR 95 | Ht 61.0 in | Wt 204.3 lb

## 2023-10-04 DIAGNOSIS — Z124 Encounter for screening for malignant neoplasm of cervix: Secondary | ICD-10-CM | POA: Diagnosis not present

## 2023-10-04 DIAGNOSIS — Z23 Encounter for immunization: Secondary | ICD-10-CM

## 2023-10-04 DIAGNOSIS — Z01419 Encounter for gynecological examination (general) (routine) without abnormal findings: Secondary | ICD-10-CM | POA: Diagnosis present

## 2023-10-04 MED ORDER — ETONOGESTREL-ETHINYL ESTRADIOL 0.12-0.015 MG/24HR VA RING: VAGINAL_RING | VAGINAL | 0 refills | Status: DC

## 2023-10-05 ENCOUNTER — Encounter: Payer: Self-pay | Admitting: Physician Assistant

## 2023-10-05 LAB — HIV ANTIBODY (ROUTINE TESTING W REFLEX): HIV Screen 4th Generation wRfx: NONREACTIVE

## 2023-10-05 LAB — HEPATITIS B SURFACE ANTIGEN: Hepatitis B Surface Ag: NEGATIVE

## 2023-10-05 LAB — HEPATITIS C ANTIBODY: Hep C Virus Ab: NONREACTIVE

## 2023-10-05 LAB — RPR: RPR Ser Ql: NONREACTIVE

## 2023-10-06 LAB — CERVICOVAGINAL ANCILLARY ONLY
Bacterial Vaginitis (gardnerella): NEGATIVE
Candida Glabrata: NEGATIVE
Candida Vaginitis: NEGATIVE
Chlamydia: NEGATIVE
Comment: NEGATIVE
Comment: NEGATIVE
Comment: NEGATIVE
Comment: NEGATIVE
Comment: NEGATIVE
Comment: NORMAL
Neisseria Gonorrhea: NEGATIVE
Trichomonas: NEGATIVE

## 2023-10-13 ENCOUNTER — Other Ambulatory Visit: Payer: Self-pay | Admitting: Physician Assistant

## 2023-10-13 DIAGNOSIS — Z01419 Encounter for gynecological examination (general) (routine) without abnormal findings: Secondary | ICD-10-CM

## 2023-11-15 ENCOUNTER — Ambulatory Visit
Admission: RE | Admit: 2023-11-15 | Discharge: 2023-11-15 | Disposition: A | Discharge status: Home / Self Care | Source: Ambulatory Visit | Attending: Family Medicine | Admitting: Family Medicine

## 2023-11-15 ENCOUNTER — Other Ambulatory Visit: Payer: Self-pay | Admitting: Family Medicine

## 2023-11-19 ENCOUNTER — Emergency Department (HOSPITAL_COMMUNITY)

## 2023-11-19 ENCOUNTER — Encounter (HOSPITAL_COMMUNITY): Payer: Self-pay

## 2023-11-19 ENCOUNTER — Other Ambulatory Visit: Payer: Self-pay

## 2023-11-19 ENCOUNTER — Emergency Department (HOSPITAL_COMMUNITY)
Admission: EM | Admit: 2023-11-19 | Discharge: 2023-11-19 | Disposition: A | Discharge status: Home / Self Care | Attending: Emergency Medicine | Admitting: Emergency Medicine

## 2023-11-19 DIAGNOSIS — W108XXA Fall (on) (from) other stairs and steps, initial encounter: Secondary | ICD-10-CM | POA: Diagnosis not present

## 2023-11-19 DIAGNOSIS — S61309A Unspecified open wound of unspecified finger with damage to nail, initial encounter: Secondary | ICD-10-CM

## 2023-11-19 DIAGNOSIS — S61305A Unspecified open wound of left ring finger with damage to nail, initial encounter: Secondary | ICD-10-CM | POA: Insufficient documentation

## 2023-11-19 DIAGNOSIS — S6992XA Unspecified injury of left wrist, hand and finger(s), initial encounter: Secondary | ICD-10-CM

## 2023-11-19 DIAGNOSIS — S60945A Unspecified superficial injury of left ring finger, initial encounter: Secondary | ICD-10-CM | POA: Diagnosis present

## 2023-11-19 MED ORDER — ACETAMINOPHEN 500 MG PO TABS
1000.0000 mg | ORAL_TABLET | Freq: Once | ORAL | Status: AC
  Administered 2023-11-19: 1000 mg via ORAL
  Filled 2023-11-19: qty 2

## 2023-11-19 MED ORDER — KETOROLAC TROMETHAMINE 30 MG/ML IJ SOLN
30.0000 mg | Freq: Once | INTRAMUSCULAR | Status: AC
  Administered 2023-11-19: 30 mg via INTRAMUSCULAR
  Filled 2023-11-19: qty 1

## 2023-11-19 NOTE — ED Notes (Signed)
[employer] at bedside 

## 2023-11-19 NOTE — ED Triage Notes (Signed)
 Pt states she fell on stairs and hit knees and left hand. Pt states the nail on her left fourth finger is hanging off.

## 2023-11-19 NOTE — Discharge Instructions (Signed)
 Steri-Strips were placed over nail to keep nail in place.  Let these fall off on their own.  You were provided with replacements as needed.   Please do not keep dressing on for more than 24 hours. Please keep finger clean and dry.  You may rinse with water daily.  Recommend waiting at least 2 weeks before going to the nail salon to get nail filed down.  You may get artificial nail removed when you are no longer having any pain or discomfort in the finger.  You may buddy tape finger as needed for comfort.  You may keep nail covered as needed for comfort.   Please return to the ED if any signs of infection (redness swelling white drainage or fever) Thank you for allowing us  to take care of you today.  We hope you begin feeling better soon.   To-Do:  Please follow-up with your primary doctor within the next 2-3 days. Please return to the Emergency Department or call 911 if you experience chest pain, shortness of breath, severe pain, severe fever, altered mental status, or have any reason to think that you need emergency medical care.  Thank you again.  Hope you feel better soon.  [facility] Department of Emergency Medicine

## 2023-11-19 NOTE — ED Provider Notes (Signed)
 [facility] Provider Note   CSN: [id number] Arrival date & time: 11/19/23  1433     History Chief Complaint  Patient presents with   Finger Injury    Elizabeth Cameron is a 28 y.o. female w/ no significant PMHx  who presents to the ED for evaluation of left ring finger injury.  Patient states she was carrying her child walking down concrete steps when she fell forward.  She reports fingernail was lifted up off finger.  She reports bleeding to the area.  Bandage prior to arrival.  No medication prior to arrival.  No other injuries or complaints Per chart review last Tdap 2023   Physical Exam Updated Vital Signs BP 124/82   Pulse 92   Temp 98.7 F (37.1 C)   Resp 17   Ht 5' 1 (1.549 m)   Wt 90.3 kg   SpO2 100%   BMI 37.60 kg/m  Physical Exam Constitutional:      Appearance: Normal appearance. She is not ill-appearing.  HENT:     Head: Normocephalic and atraumatic.   Cardiovascular:     Rate and Rhythm: Normal rate.  Pulmonary:     Effort: Pulmonary effort is normal.   Musculoskeletal:        General: No swelling. Normal range of motion.   Skin:    Capillary Refill: Capillary refill takes less than 2 seconds.     Comments: Dried blood inferior to acrylic nail of left ring finger.  Small linear laceration to lateral nailbed which is also hemostatic.  Acrylic nail still firmly in place.  Normal range of motion of fingers and wrist. No injury to germinal matrix    Neurological:     General: No focal deficit present.     Mental Status: She is alert.     ED Results / Procedures / Treatments     Radiology DG Finger Ring Left Result Date: 11/19/2023 CLINICAL DATA:  Left fourth finger injury. EXAM: LEFT RING FINGER 2+V COMPARISON:  None Available. FINDINGS: Normal bone mineralization. Joint spaces are preserved. No acute fracture is seen. No dislocation. IMPRESSION: Normal left fourth finger radiographs. Electronically Signed    By: Bertina Broccoli M.D.   On: 11/19/2023 15:46    Medications Ordered in ED Medications  ketorolac (TORADOL) 30 MG/ML injection 30 mg (30 mg Intramuscular Given 11/19/23 1548)  acetaminophen  (TYLENOL ) tablet 1,000 mg (1,000 mg Oral Given 11/19/23 1547)    ED Course/ Medical Decision Making/ A&P  Elizabeth Cameron is a 28 y.o. female presents as detailed above  Differential ddx: fracture, nail bed abrasion  On arrival, patient afebrile hemodynamically stable no hypoxia or respiratory distress. Dried blood inferior to acrylic nail of left ring finger.  Small linear laceration to lateral nailbed which is also hemostatic.  Acrylic nail still firmly in place.  Normal range of motion of fingers and wrist. No injury to germinal matrix    ED Work-up: Please see details of labs and imaging listed above. Patient received IM Toradol as well as oral Tylenol .  Patient states she needs to drive therefore does not want narcotics. X-ray of left hand/finger obtained does not reveal any acute fracture or dislocation. Wound thoroughly irrigated.  Steri-Strips applied to assist with pain and keep nail in place.  Advised patient to keep Steri-Strips on and let nail grow out for at least 2 weeks.  Advised she needs to go to the nail salon to get nail filed down.  Advised  likely best if she lets nail completely grow out to allow nailbed to heal.  Wound care instructions provided at bedside.  Reviewed infection precautions at bedside as well.  Patient stable for discharge outpatient follow-up    Overall impression nail avulsion injury Patient stable for discharge and outpatient follow-up. Strict return precautions provided. Patient voices understanding and agrees with plan.   Patient seen with supervising physician who agrees with plan.  Final Clinical Impression(s) / ED Diagnoses Final diagnoses:  Injury of finger of left hand, initial encounter  Nail avulsion, finger, initial encounter      Angele Keller, DO PGY-3 Emergency Medicine    Angele Keller, DO 11/19/23 1738    Albertus Hughs, DO 11/19/23 2201

## 2024-01-26 ENCOUNTER — Ambulatory Visit (HOSPITAL_COMMUNITY)
Admission: EM | Admit: 2024-01-26 | Discharge: 2024-01-26 | Disposition: A | Discharge status: Home / Self Care | Attending: Family Medicine | Admitting: Family Medicine

## 2024-01-26 ENCOUNTER — Encounter (HOSPITAL_COMMUNITY): Payer: Self-pay

## 2024-01-26 DIAGNOSIS — L03114 Cellulitis of left upper limb: Secondary | ICD-10-CM | POA: Diagnosis not present

## 2024-01-26 DIAGNOSIS — W57XXXA Bitten or stung by nonvenomous insect and other nonvenomous arthropods, initial encounter: Secondary | ICD-10-CM | POA: Diagnosis not present

## 2024-01-26 DIAGNOSIS — S50862A Insect bite (nonvenomous) of left forearm, initial encounter: Secondary | ICD-10-CM

## 2024-01-26 MED ORDER — CLINDAMYCIN HCL 300 MG PO CAPS: 300.0000 mg | ORAL_CAPSULE | Freq: Three times a day (TID) | ORAL | 0 refills | Status: AC

## 2024-01-26 MED ORDER — GENTAMICIN SULFATE 40 MG/ML IJ SOLN
INTRAMUSCULAR | Status: AC
  Filled 2024-01-26: qty 6

## 2024-01-26 MED ORDER — GENTAMICIN SULFATE 40 MG/ML IJ SOLN
240.0000 mg | Freq: Once | INTRAMUSCULAR | Status: AC
  Administered 2024-01-26: 240 mg via INTRAMUSCULAR

## 2024-01-26 NOTE — ED Provider Notes (Signed)
 [facility]    CSN: [id number] Arrival date & time: 01/26/24  1454      History   Chief Complaint Chief Complaint  Patient presents with   Insect Bite    HPI Elizabeth Cameron is a 28 y.o. female.   Patient is here for insect bite to the left forearm.  It happened yesterday afternoon.  She got a box out of storage, and thinks a but/insect may have bit her.  She did pop two blisters yesterday, and squeezed it.  The left arm is now more red, swollen compared to yesterday.  She felt warm, no fevers/chills per se.   She is requesting a shot of an antibiotic today given the redness/infection.  She has a severe allergy to pcn.       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 Authorizing Provider  albuterol  (VENTOLIN  HFA) 108 (90 Base) MCG/ACT inhaler Inhale 1-2 puffs into the lungs every 6 (six) hours as needed for wheezing or shortness of breath. 08/15/23   Enedelia Dorna HERO, FNP  clindamycin  (CLEOCIN ) 300 MG capsule Take 1 capsule (300 mg total) by mouth 2  (two) times daily. For seven days Patient not taking: Reported on 10/04/2023 09/14/22   Constant, Peggy, MD  etonogestrel -ethinyl estradiol  (NUVARING) 0.12-0.015 MG/24HR vaginal ring INSERT 1 RING VAGINALLY AND LEAVE IN PLACE FOR 3 CONSECUTIVE WEEKS THEN REMOVE FOR 1 WEEK 10/13/23   Davis, Devon E, PA-C  fluconazole  (DIFLUCAN ) 150 MG tablet Take 1 tablet (150 mg total) by mouth every 3 (three) days. Patient not taking: Reported on 01/26/2024 08/15/23   Enedelia Dorna HERO, FNP    Family History Family History  Problem Relation Age of Onset   Depression Mother        Lorazepam, prestige   Anxiety disorder Mother    Bipolar disorder Mother        seroquel   Hypertension Father    Depression Father    Anxiety disorder Father    Heart disease Father    Hypertension Maternal Grandmother    Diabetes Maternal Grandmother    Hypertension Maternal Grandfather    Diabetes Maternal Grandfather    Schizophrenia Paternal Grandmother    Dementia Paternal Grandmother     Social  History Social History   Tobacco Use   Smoking status: Former    Current packs/day: 0.10    Average packs/day: 0.1 packs/day for 0.6 years (0.1 ttl pk-yrs)    Types: Cigarettes    Passive exposure: Never   Smokeless tobacco: Never   Tobacco comments:    Gets nauseous when she smokes too much.   Interested in Engineer, maintenance (IT) for anxiety, stress  Vaping Use   Vaping status: Never Used  Substance Use Topics   Alcohol use: Not Currently    Comment: occ   Drug use: Not Currently    Types: Marijuana    Comment: occasional use.  2 blunts.     Allergies   Penicillins and Metronidazole    Review of Systems Review of Systems   Physical Exam Triage Vital Signs ED Triage Vitals [01/26/24 1514]  Encounter Vitals Group     BP 118/81     Girls Systolic BP Percentile      Girls Diastolic BP Percentile      Boys Systolic BP Percentile      Boys Diastolic BP Percentile      Pulse Rate 71     Resp 16      Temp 98.4 F (36.9 C)     Temp Source Oral     SpO2 97 %     Weight      Height      Head Circumference      Peak Flow      Pain Score 10     Pain Loc      Pain Education      Exclude from Growth Chart    No data found.  Updated Vital Signs BP 118/81 (BP Location: Left Arm)   Pulse 71   Temp 98.4 F (36.9 C) (Oral)   Resp 16   LMP 01/08/2024 (Approximate)   SpO2 97%   Visual Acuity Right Eye Distance:   Left Eye Distance:   Bilateral Distance:    Right Eye Near:   Left Eye Near:    Bilateral Near:     Physical Exam Constitutional:      Appearance: Normal appearance.  HENT:     Mouth/Throat:     Mouth: Mucous membranes are moist.  Cardiovascular:     Rate and Rhythm: Normal rate and regular rhythm.  Pulmonary:     Effort: Pulmonary effort is normal.     Breath sounds: Normal breath sounds.  Skin:    Comments: 8.5 cm x 13 cm area of redness, erythema to the left anterior forearm;  in the center is a small scabbed area;   Neurological:     General: No focal deficit present.     Mental Status: She is alert.  Psychiatric:        Mood and Affect: Mood normal.      UC Treatments / Results  Labs (all labs ordered are listed, but only abnormal results are displayed) Labs Reviewed - No data to display  EKG   Radiology No results found.  Procedures Procedures (including critical care time)  Medications Ordered in UC Medications  gentamicin  (GARAMYCIN ) injection 240 mg (has no administration in time range)    Initial Impression / Assessment and Plan / UC Course  I have reviewed the triage vital signs and the nursing notes.  Pertinent labs & imaging results that were available during my care of the patient were reviewed by me and considered in my medical decision making (see chart for  details).    Final Clinical Impressions(s) / UC Diagnoses   Final diagnoses:  Cellulitis of left upper extremity  Insect bite of left forearm, initial encounter      Discharge Instructions      You were seen today for an insect bite with cellulitis to the left arm.  You were given a shot of an antibiotic today, and I have sent out oral antibiotic to start tomorrow.  Please keep the arm elevated and use ice for pain and swelling.  The area has been marked today.  If this worsens significantly, then please go to the ER for further evaluation.     ED Prescriptions     Medication Sig Dispense Auth. Provider   clindamycin  (CLEOCIN ) 300 MG capsule Take 1 capsule (300 mg total) by mouth 3 (three) times daily for 7 days. 21 capsule Darral Longs, MD      PDMP not reviewed this encounter.   Darral Longs, MD 01/26/24 [phone]

## 2024-01-26 NOTE — ED Triage Notes (Signed)
 Patient reports that she began having swelling and redness to her left forearm following an insect bite. Ptien t states she was getting stuff out of a storage building and was bit by a possible spider.  Patient states she has used a mixture of avocado oil and alcohol on the area. And took a Benadryl last night.

## 2024-01-26 NOTE — Discharge Instructions (Signed)
 You were seen today for an insect bite with cellulitis to the left arm.  You were given a shot of an antibiotic today, and I have sent out oral antibiotic to start tomorrow.  Please keep the arm elevated and use ice for pain and swelling.  The area has been marked today.  If this worsens significantly, then please go to the ER for further evaluation.

## 2024-03-26 ENCOUNTER — Inpatient Hospital Stay (HOSPITAL_COMMUNITY)
Admission: AD | Admit: 2024-03-26 | Discharge: 2024-04-01 | DRG: 880 | Disposition: A | Discharge status: Home / Self Care | Source: Ambulatory Visit | Attending: Student in an Organized Health Care Education/Training Program | Admitting: Student in an Organized Health Care Education/Training Program

## 2024-03-26 ENCOUNTER — Ambulatory Visit (HOSPITAL_COMMUNITY)
Admission: EM | Admit: 2024-03-26 | Discharge: 2024-03-26 | Disposition: A | Discharge status: Other Acute Inpatient Hospital | Attending: Psychiatry | Admitting: Psychiatry

## 2024-03-26 DIAGNOSIS — Z888 Allergy status to other drugs, medicaments and biological substances status: Secondary | ICD-10-CM

## 2024-03-26 DIAGNOSIS — Z560 Unemployment, unspecified: Secondary | ICD-10-CM | POA: Diagnosis not present

## 2024-03-26 DIAGNOSIS — Z87892 Personal history of anaphylaxis: Secondary | ICD-10-CM

## 2024-03-26 DIAGNOSIS — F332 Major depressive disorder, recurrent severe without psychotic features: Secondary | ICD-10-CM | POA: Diagnosis present

## 2024-03-26 DIAGNOSIS — L292 Pruritus vulvae: Secondary | ICD-10-CM | POA: Diagnosis present

## 2024-03-26 DIAGNOSIS — F419 Anxiety disorder, unspecified: Secondary | ICD-10-CM | POA: Insufficient documentation

## 2024-03-26 DIAGNOSIS — F902 Attention-deficit hyperactivity disorder, combined type: Secondary | ICD-10-CM | POA: Diagnosis present

## 2024-03-26 DIAGNOSIS — F913 Oppositional defiant disorder: Secondary | ICD-10-CM | POA: Diagnosis present

## 2024-03-26 DIAGNOSIS — G4700 Insomnia, unspecified: Secondary | ICD-10-CM | POA: Diagnosis present

## 2024-03-26 DIAGNOSIS — Z5982 Transportation insecurity: Secondary | ICD-10-CM | POA: Diagnosis not present

## 2024-03-26 DIAGNOSIS — R45851 Suicidal ideations: Principal | ICD-10-CM | POA: Diagnosis present

## 2024-03-26 DIAGNOSIS — Z8249 Family history of ischemic heart disease and other diseases of the circulatory system: Secondary | ICD-10-CM

## 2024-03-26 DIAGNOSIS — F4310 Post-traumatic stress disorder, unspecified: Secondary | ICD-10-CM | POA: Diagnosis present

## 2024-03-26 DIAGNOSIS — I10 Essential (primary) hypertension: Secondary | ICD-10-CM | POA: Diagnosis present

## 2024-03-26 DIAGNOSIS — Z62810 Personal history of physical and sexual abuse in childhood: Secondary | ICD-10-CM | POA: Diagnosis not present

## 2024-03-26 DIAGNOSIS — Z634 Disappearance and death of family member: Secondary | ICD-10-CM | POA: Diagnosis not present

## 2024-03-26 DIAGNOSIS — Z79899 Other long term (current) drug therapy: Secondary | ICD-10-CM | POA: Diagnosis not present

## 2024-03-26 DIAGNOSIS — Z87891 Personal history of nicotine dependence: Secondary | ICD-10-CM

## 2024-03-26 DIAGNOSIS — Z833 Family history of diabetes mellitus: Secondary | ICD-10-CM | POA: Diagnosis not present

## 2024-03-26 DIAGNOSIS — Z880 Allergy status to penicillin: Secondary | ICD-10-CM | POA: Diagnosis not present

## 2024-03-26 DIAGNOSIS — Z818 Family history of other mental and behavioral disorders: Secondary | ICD-10-CM | POA: Diagnosis not present

## 2024-03-26 LAB — POCT URINE DRUG SCREEN - MANUAL ENTRY (I-SCREEN)
POC Amphetamine UR: NOT DETECTED
POC Buprenorphine (BUP): NOT DETECTED
POC Cocaine UR: NOT DETECTED
POC Marijuana UR: NOT DETECTED
POC Methadone UR: NOT DETECTED
POC Methamphetamine UR: NOT DETECTED
POC Morphine: NOT DETECTED
POC Oxazepam (BZO): NOT DETECTED
POC Oxycodone UR: NOT DETECTED
POC Secobarbital (BAR): NOT DETECTED

## 2024-03-26 LAB — CBC WITH DIFFERENTIAL/PLATELET
Abs Immature Granulocytes: 0.01 K/uL (ref 0.00–0.07)
Basophils Absolute: 0 K/uL (ref 0.0–0.1)
Basophils Relative: 0 %
Eosinophils Absolute: 0.3 K/uL (ref 0.0–0.5)
Eosinophils Relative: 5 %
HCT: 40.5 % (ref 36.0–46.0)
Hemoglobin: 13.5 g/dL (ref 12.0–15.0)
Immature Granulocytes: 0 %
Lymphocytes Relative: 39 %
Lymphs Abs: 1.9 K/uL (ref 0.7–4.0)
MCH: 28.2 pg (ref 26.0–34.0)
MCHC: 33.3 g/dL (ref 30.0–36.0)
MCV: 84.7 fL (ref 80.0–100.0)
Monocytes Absolute: 0.4 K/uL (ref 0.1–1.0)
Monocytes Relative: 9 %
Neutro Abs: 2.3 K/uL (ref 1.7–7.7)
Neutrophils Relative %: 47 %
Platelets: 246 K/uL (ref 150–400)
RBC: 4.78 MIL/uL (ref 3.87–5.11)
RDW: 13.3 % (ref 11.5–15.5)
WBC: 5 K/uL (ref 4.0–10.5)
nRBC: 0 % (ref 0.0–0.2)

## 2024-03-26 LAB — URINALYSIS, COMPLETE (UACMP) WITH MICROSCOPIC
Bilirubin Urine: NEGATIVE
Glucose, UA: NEGATIVE mg/dL
Hgb urine dipstick: NEGATIVE
Ketones, ur: NEGATIVE mg/dL
Leukocytes,Ua: NEGATIVE
Nitrite: NEGATIVE
Protein, ur: 30 mg/dL — AB
Specific Gravity, Urine: 1.027 (ref 1.005–1.030)
pH: 5 (ref 5.0–8.0)

## 2024-03-26 LAB — COMPREHENSIVE METABOLIC PANEL WITH GFR
ALT: 29 U/L (ref 0–44)
AST: 19 U/L (ref 15–41)
Albumin: 3.7 g/dL (ref 3.5–5.0)
Alkaline Phosphatase: 70 U/L (ref 38–126)
Anion gap: 8 (ref 5–15)
BUN: 8 mg/dL (ref 6–20)
CO2: 21 mmol/L — ABNORMAL LOW (ref 22–32)
Calcium: 9 mg/dL (ref 8.9–10.3)
Chloride: 108 mmol/L (ref 98–111)
Creatinine, Ser: 1.06 mg/dL — ABNORMAL HIGH (ref 0.44–1.00)
GFR, Estimated: >60 mL/min (ref >60)
Glucose, Bld: 82 mg/dL (ref 70–99)
Potassium: 4.1 mmol/L (ref 3.5–5.1)
Sodium: 137 mmol/L (ref 135–145)
Total Bilirubin: 0.5 mg/dL (ref 0.0–1.2)
Total Protein: 7.2 g/dL (ref 6.5–8.1)

## 2024-03-26 LAB — MAGNESIUM: Magnesium: 1.9 mg/dL (ref 1.7–2.4)

## 2024-03-26 LAB — HEMOGLOBIN A1C
Hgb A1c MFr Bld: 5.2 % (ref 4.8–5.6)
Mean Plasma Glucose: 102.54 mg/dL

## 2024-03-26 LAB — LIPID PANEL
Cholesterol: 211 mg/dL — ABNORMAL HIGH (ref 0–200)
HDL: 73 mg/dL (ref >40)
LDL Cholesterol: 125 mg/dL — ABNORMAL HIGH (ref 0–99)
Total CHOL/HDL Ratio: 2.9 ratio
Triglycerides: 67 mg/dL (ref <150)
VLDL: 13 mg/dL (ref 0–40)

## 2024-03-26 LAB — TSH: TSH: 0.464 u[IU]/mL (ref 0.350–4.500)

## 2024-03-26 LAB — ETHANOL: Alcohol, Ethyl (B): <15 mg/dL (ref <15)

## 2024-03-26 LAB — POC URINE PREG, ED: Preg Test, Ur: NEGATIVE

## 2024-03-26 MED ORDER — DIPHENHYDRAMINE HCL 50 MG/ML IJ SOLN: 50.0000 mg | Freq: Three times a day (TID) | INTRAMUSCULAR | Status: DC | PRN

## 2024-03-26 MED ORDER — HYDROXYZINE HCL 25 MG PO TABS
25.0000 mg | ORAL_TABLET | Freq: Three times a day (TID) | ORAL | Status: DC | PRN
  Administered 2024-03-26 – 2024-03-28 (×6): 25 mg via ORAL
  Filled 2024-03-26 (×9): qty 1

## 2024-03-26 MED ORDER — HALOPERIDOL LACTATE 5 MG/ML IJ SOLN: 5.0000 mg | Freq: Three times a day (TID) | INTRAMUSCULAR | Status: DC | PRN

## 2024-03-26 MED ORDER — MAGNESIUM HYDROXIDE 400 MG/5ML PO SUSP: 30.0000 mL | Freq: Every day | ORAL | Status: DC | PRN

## 2024-03-26 MED ORDER — TRAZODONE HCL 50 MG PO TABS
50.0000 mg | ORAL_TABLET | Freq: Every evening | ORAL | Status: DC | PRN
  Administered 2024-03-26 – 2024-03-31 (×5): 50 mg via ORAL
  Filled 2024-03-26 (×5): qty 1

## 2024-03-26 MED ORDER — TRAZODONE HCL 50 MG PO TABS: 50.0000 mg | ORAL_TABLET | Freq: Every evening | ORAL | Status: DC | PRN

## 2024-03-26 MED ORDER — DIPHENHYDRAMINE HCL 50 MG PO CAPS: 50.0000 mg | ORAL_CAPSULE | Freq: Three times a day (TID) | ORAL | Status: DC | PRN

## 2024-03-26 MED ORDER — ACETAMINOPHEN 325 MG PO TABS: 650.0000 mg | ORAL_TABLET | Freq: Four times a day (QID) | ORAL | Status: DC | PRN

## 2024-03-26 MED ORDER — ALUM & MAG HYDROXIDE-SIMETH 200-200-20 MG/5ML PO SUSP: 30.0000 mL | ORAL | Status: DC | PRN

## 2024-03-26 MED ORDER — HALOPERIDOL LACTATE 5 MG/ML IJ SOLN: 10.0000 mg | Freq: Three times a day (TID) | INTRAMUSCULAR | Status: DC | PRN

## 2024-03-26 MED ORDER — LORAZEPAM 2 MG/ML IJ SOLN: 2.0000 mg | Freq: Three times a day (TID) | INTRAMUSCULAR | Status: DC | PRN

## 2024-03-26 MED ORDER — HALOPERIDOL 5 MG PO TABS: 5.0000 mg | ORAL_TABLET | Freq: Three times a day (TID) | ORAL | Status: DC | PRN

## 2024-03-26 MED ORDER — DIPHENHYDRAMINE HCL 25 MG PO CAPS: 50.0000 mg | ORAL_CAPSULE | Freq: Three times a day (TID) | ORAL | Status: DC | PRN

## 2024-03-26 NOTE — ED Provider Notes (Signed)
 [facility] Continuous Assessment Admission H&P  Date: 03/26/24 Patient Name: Elizabeth Cameron MRN: [id number] Chief Complaint: I've had too many losses and can't go on.  Diagnoses:  Major depressive disorder, recurrent, severe Posttraumatic stress disorder  HPI: Patient presents via [facility] following a virtual meeting with her therapist at [facility]. During the meeting patient voiced SI, and expressed intent while the kids are safe at school. Patient is tearful throughout triage, appearing distressed! She voices SI, admitting she has considered suicide, and again mentions she considered an attempt while the kids are in school. She no longer identifies the kids as protective factors, stating, They will be okay and in 5 years or so, they can move on. She also relays she has considered the financial struggle for someone to care for her 3 children(ages 11,7 and 9) by obtaining life insurance last week. Patient reports multiple stresors, to include many deaths in her family over the past 5 years. She reports PTSD symptoms after havinig to be the one to identify her brother when he died, stating, They pulled the drawer out with him there. Patient states several times, she is just done and it's too much throughout triage. She then attempts to affirm her safety, expressing concern that her mother is the only person to care for her kids and her mother has recently had knee surgery. Patient is unable to reliably affirm her safety. She denies HI, AVH or SA hx.   I feel like something is always going to happen. Patient medication managed by PCP Johnston Olea. Patient cannot express clear understanding of psychiatric/somatic medication regimen or utility. She is likely on lamotrigine and prazosin. She often forgets to take her nifedipine (HTN). Her appetite has diminished and she becomes nauseated when taking certain meds without food.   Total Time spent with patient: 45 minutes  Musculoskeletal   Strength & Muscle Tone: within normal limits Gait & Station: normal Patient leans: N/A  Psychiatric Specialty Exam  Presentation General Appearance:  Appropriate for Environment  Eye Contact: Fleeting  Speech: Slow  Speech Volume: Decreased  Handedness: Right   Mood and Affect  Mood: Depressed  Affect: Congruent   Thought Process  Thought Processes: Coherent; Goal Directed  Descriptions of Associations:Intact  Orientation:Full (Time, Place and Person)  Thought Content:Logical  Diagnosis of Schizophrenia or Schizoaffective disorder in past: No   Hallucinations:Hallucinations: None  Ideas of Reference:None  Suicidal Thoughts:Suicidal Thoughts: Yes, Active SI Active Intent and/or Plan: With Intent  Homicidal Thoughts:Homicidal Thoughts: No   Sensorium  Memory: Immediate Fair; Recent Fair; Remote Fair  Judgment: Impaired  Insight: Fair   Chartered certified accountant: Fair  Attention Span: Fair  Recall: [employer] of Knowledge: Fair  Language: Fair   Psychomotor Activity  Psychomotor Activity: Psychomotor Activity: Decreased   Assets  Assets: Manufacturing systems engineer; Desire for Improvement; Housing; Resilience   Sleep  Sleep:No data recorded  No data recorded  Physical Exam ROS  Blood pressure (!) 134/99, pulse 69, temperature 98.2 F (36.8 C), temperature source Oral, resp. rate 16, SpO2 100%. There is no height or weight on file to calculate BMI.  Past Psychiatric History: h/o ODD, ADHD, cannabis use disorder   Alcohol/Drug Use: Alcohol / Drug Use Pain Medications: See MAR Prescriptions: See MAR Over the Counter: See MAR History of alcohol / drug use?: distant h/o cannabis use  Is the patient at risk to self? Yes  Has the patient been a risk to self in the past 6 months? Yes .  Has the patient been a risk to self within the distant past? Yes   Is the patient a risk to others? No   Has the patient been  a risk to others in the past 6 months? No   Has the patient been a risk to others within the distant past? No   Past Medical History: HTN, dyslipidemia, asthma  Social History: Marital status: Single Does patient have children?: Yes How many children?: 3 How is patient's relationship with their children?: Patient has 3 children, ages 74,9 and 62.  Patient reports good relationships with children.  Last Labs:  Admission on 03/26/2024  Component Date Value Ref Range Status   WBC 03/26/2024 5.0  4.0 - 10.5 K/uL Final   RBC 03/26/2024 4.78  3.87 - 5.11 MIL/uL Final   Hemoglobin 03/26/2024 13.5  12.0 - 15.0 g/dL Final   HCT 89/78/7974 40.5  36.0 - 46.0 % Final   MCV 03/26/2024 84.7  80.0 - 100.0 fL Final   MCH 03/26/2024 28.2  26.0 - 34.0 pg Final   MCHC 03/26/2024 33.3  30.0 - 36.0 g/dL Final   RDW 89/78/7974 13.3  11.5 - 15.5 % Final   Platelets 03/26/2024 246  150 - 400 K/uL Final   nRBC 03/26/2024 0.0  0.0 - 0.2 % Final   Neutrophils Relative % 03/26/2024 47  % Final   Neutro Abs 03/26/2024 2.3  1.7 - 7.7 K/uL Final   Lymphocytes Relative 03/26/2024 39  % Final   Lymphs Abs 03/26/2024 1.9  0.7 - 4.0 K/uL Final   Monocytes Relative 03/26/2024 9  % Final   Monocytes Absolute 03/26/2024 0.4  0.1 - 1.0 K/uL Final   Eosinophils Relative 03/26/2024 5  % Final   Eosinophils Absolute 03/26/2024 0.3  0.0 - 0.5 K/uL Final   Basophils Relative 03/26/2024 0  % Final   Basophils Absolute 03/26/2024 0.0  0.0 - 0.1 K/uL Final   Immature Granulocytes 03/26/2024 0  % Final   Abs Immature Granulocytes 03/26/2024 0.01  0.00 - 0.07 K/uL Final   Performed at [facility], [address], [place], [place] [address]   Sodium 03/26/2024 137  135 - 145 mmol/L Final   Potassium 03/26/2024 4.1  3.5 - 5.1 mmol/L Final   Chloride 03/26/2024 108  98 - 111 mmol/L Final   CO2 03/26/2024 21 (L)  22 - 32 mmol/L Final   Glucose, Bld 03/26/2024 82  70 - 99 mg/dL Final   Glucose reference range applies only  to samples taken after fasting for at least 8 hours.   BUN 03/26/2024 8  6 - 20 mg/dL Final   Creatinine, Ser 03/26/2024 1.06 (H)  0.44 - 1.00 mg/dL Final   Calcium 89/78/7974 9.0  8.9 - 10.3 mg/dL Final   Total Protein 89/78/7974 7.2  6.5 - 8.1 g/dL Final   Albumin 89/78/7974 3.7  3.5 - 5.0 g/dL Final   AST 89/78/7974 19  15 - 41 U/L Final   ALT 03/26/2024 29  0 - 44 U/L Final   Alkaline Phosphatase 03/26/2024 70  38 - 126 U/L Final   Total Bilirubin 03/26/2024 0.5  0.0 - 1.2 mg/dL Final   GFR, Estimated 03/26/2024 >60  >60 mL/min Final   Comment: (NOTE) Calculated using the CKD-EPI Creatinine Equation (2021)    Anion gap 03/26/2024 8  5 - 15 Final   Performed at [facility], [address] [place], [place] [address]   Hgb A1c MFr Bld  03/26/2024 5.2  4.8 - 5.6 % Final   Comment: (NOTE) Diagnosis of Diabetes The following HbA1c ranges recommended by the American Diabetes Association (ADA) may be used as an aid in the diagnosis of diabetes mellitus.  Hemoglobin             Suggested A1C NGSP%              Diagnosis  <5.7                   Non Diabetic  5.7-6.4                Pre-Diabetic  >6.4                   Diabetic  <7.0                   Glycemic control for                       adults with diabetes.     Mean Plasma Glucose 03/26/2024 102.54  mg/dL Final   Performed at [facility], [address] [place], [place] [address]   Magnesium 03/26/2024 1.9  1.7 - 2.4 mg/dL Final   Performed at [facility], [address], [place], [place] [address]   Alcohol, Ethyl (B) 03/26/2024 <15  <15 mg/dL Final   Comment: (NOTE) For medical purposes only. Performed at [facility], [address], [place], [place] [address]    Cholesterol 03/26/2024 211 (H)  0 - 200 mg/dL Final   Triglycerides 89/78/7974 67  <150 mg/dL Final   HDL 89/78/7974 73  >40 mg/dL Final   Total CHOL/HDL Ratio 03/26/2024 2.9  RATIO Final   VLDL 03/26/2024 13  0 - 40 mg/dL  Final   LDL Cholesterol 03/26/2024 125 (H)  0 - 99 mg/dL Final   Comment:        Total Cholesterol/HDL:CHD Risk Coronary Heart Disease Risk Table                     Men   Women  1/2 Average Risk   3.4   3.3  Average Risk       5.0   4.4  2 X Average Risk   9.6   7.1  3 X Average Risk  23.4   11.0        Use the calculated Patient Ratio above and the CHD Risk Table to determine the patient's CHD Risk.        ATP III CLASSIFICATION (LDL):  <100     mg/dL   Optimal  899-870  mg/dL   Near or Above                    Optimal  130-159  mg/dL   Borderline  839-810  mg/dL   High  >809     mg/dL   Very High Performed at [facility], [address] [place], [place] [address]    TSH 03/26/2024 0.464  0.350 - 4.500 uIU/mL Final   Comment: Performed by a 3rd Generation assay with a functional sensitivity of <=0.01 uIU/mL. Performed at [facility], [address] [place], [place] [address]    Color, Urine 03/26/2024 AMBER (A)  YELLOW Final   BIOCHEMICALS MAY BE AFFECTED BY COLOR   APPearance 03/26/2024 CLOUDY (A)  CLEAR Final   Specific Gravity,  Urine 03/26/2024 1.027  1.005 - 1.030 Final   pH 03/26/2024 5.0  5.0 - 8.0 Final   Glucose, UA 03/26/2024 NEGATIVE  NEGATIVE mg/dL Final   Hgb urine dipstick 03/26/2024 NEGATIVE  NEGATIVE Final   Bilirubin Urine 03/26/2024 NEGATIVE  NEGATIVE Final   Ketones, ur 03/26/2024 NEGATIVE  NEGATIVE mg/dL Final   Protein, ur 89/78/7974 30 (A)  NEGATIVE mg/dL Final   Nitrite 89/78/7974 NEGATIVE  NEGATIVE Final   Leukocytes,Ua 03/26/2024 NEGATIVE  NEGATIVE Final   RBC / HPF 03/26/2024 0-5  0 - 5 RBC/hpf Final   WBC, UA 03/26/2024 6-10  0 - 5 WBC/hpf Final   Bacteria, UA 03/26/2024 MANY (A)  NONE SEEN Final   Squamous Epithelial / HPF 03/26/2024 0-5  0 - 5 /HPF Final   Mucus 03/26/2024 PRESENT   Final   Performed at [facility], [address], [place], [place] [address]   POC Amphetamine UR 03/26/2024 None Detected  NONE DETECTED  (Cut Off Level 1000 ng/mL) Final   POC Secobarbital (BAR) 03/26/2024 None Detected  NONE DETECTED (Cut Off Level 300 ng/mL) Final   POC Buprenorphine (BUP) 03/26/2024 None Detected  NONE DETECTED (Cut Off Level 10 ng/mL) Final   POC Oxazepam (BZO) 03/26/2024 None Detected  NONE DETECTED (Cut Off Level 300 ng/mL) Final   POC Cocaine UR 03/26/2024 None Detected  NONE DETECTED (Cut Off Level 300 ng/mL) Final   POC Methamphetamine UR 03/26/2024 None Detected  NONE DETECTED (Cut Off Level 1000 ng/mL) Final   POC Morphine 03/26/2024 None Detected  NONE DETECTED (Cut Off Level 300 ng/mL) Final   POC Methadone UR 03/26/2024 None Detected  NONE DETECTED (Cut Off Level 300 ng/mL) Final   POC Oxycodone UR 03/26/2024 None Detected  NONE DETECTED (Cut Off Level 100 ng/mL) Final   POC Marijuana UR 03/26/2024 None Detected  NONE DETECTED (Cut Off Level 50 ng/mL) Final   Preg Test, Ur 03/26/2024 Negative  Negative Final  Office Visit on 10/04/2023  Component Date Value Ref Range Status   Neisseria Gonorrhea 10/04/2023 Negative   Final   Chlamydia 10/04/2023 Negative   Final   Trichomonas 10/04/2023 Negative   Final   Bacterial Vaginitis (gardnerella) 10/04/2023 Negative   Final   Candida Vaginitis 10/04/2023 Negative   Final   Candida Glabrata 10/04/2023 Negative   Final   Comment 10/04/2023 Normal Reference Range Candida Species - Negative   Final   Comment 10/04/2023 Normal Reference Range Candida Galbrata - Negative   Final   Comment 10/04/2023 Normal Reference Range Trichomonas - Negative   Final   Comment 10/04/2023 Normal Reference Ranger Chlamydia - Negative   Final   Comment 10/04/2023 Normal Reference Range Neisseria Gonorrhea - Negative   Final   Comment 10/04/2023 Normal Reference Range Bacterial Vaginosis - Negative   Final   HIV Screen 4th Generation wRfx 10/04/2023 Non Reactive  Non Reactive Final   Comment: HIV-1/HIV-2 antibodies and HIV-1 p24 antigen were NOT detected. There is no  laboratory evidence of HIV infection. HIV Negative    RPR Ser Ql 10/04/2023 Non Reactive  Non Reactive Final   Hep C Virus Ab 10/04/2023 Non Reactive  Non Reactive Final   Comment: HCV antibody alone does not differentiate between previously resolved infection and active infection. Equivocal and Reactive HCV antibody results should be followed up with an HCV RNA test to support the diagnosis of active HCV infection.    Hepatitis B Surface Ag 10/04/2023 Negative  Negative Final  Allergies: Penicillins and Metronidazole   Medications:  Facility Ordered Medications  Medication   acetaminophen  (TYLENOL ) tablet 650 mg   alum & mag hydroxide-simeth (MAALOX/MYLANTA) 200-200-20 MG/5ML suspension 30 mL   magnesium hydroxide (MILK OF MAGNESIA) suspension 30 mL   traZODone (DESYREL) tablet 50 mg   haloperidol (HALDOL) tablet 5 mg   And   diphenhydrAMINE (BENADRYL) capsule 50 mg   PTA Medications  Medication Sig   albuterol  (VENTOLIN  HFA) 108 (90 Base) MCG/ACT inhaler Inhale 1-2 puffs into the lungs every 6 (six) hours as needed for wheezing or shortness of breath.   etonogestrel -ethinyl estradiol  (NUVARING) 0.12-0.015 MG/24HR vaginal ring INSERT 1 RING VAGINALLY AND LEAVE IN PLACE FOR 3 CONSECUTIVE WEEKS THEN REMOVE FOR 1 WEEK      Medical Decision Making  28yo female with major depressive disorder, severe, generalized anxiety disorder and post Traumatic stress disorder presents with suicidal ideation with intent. Medication regimen and Therapy of limited utility but revelation to therapist resulted in [facility]PD transport of patient to  [facility] for evaluation.    Recommendations  Based on my evaluation the patient does not appear to have an emergency medical condition. Voluntary admission to [facility] OBS pending availability of an appropriate inpatient psychiatric bed.  KANDI JAYSON HAHN, MD 03/26/24  6:47 PM
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
## 2024-03-26 NOTE — ED Notes (Signed)
 Patient is alert and oriented x4 with no acute distress noted.  Patient currently denies SI/HI/AVH at this time.  Patient appears to be agitated that she has not had an update regarding transfer to [facility].  This Clinical research associate informed her she will receive an update once provider places pre-admit orders.  Patient is currently watching TV at his time.
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
## 2024-03-26 NOTE — ED Notes (Signed)
 Pt is stabilized with no acute distress noted.  Patient has been transported to [facility] via Psychologist, educational.

## 2024-03-26 NOTE — Progress Notes (Signed)
 Pt has been accepted to [facility] on 03/26/2024 Bed assignment: 304-02  Pt meets inpatient criteria per: Kandi Hahn MD   Attending Physician will be: Dr. Raliegh MD  Report can be called to: unit: Adult unit: [phone]  Pt can arrive after pending labs, vol, uds, EKG.   Care Team Notified: [facility] Cherylynn Ernst RN, Kandi Hahn MD   [place] Elna Radovich LCSW-A   03/26/2024 2:21 PM

## 2024-03-26 NOTE — ED Notes (Signed)
 Patient presented to nurses station upset that she had not been transferred to [facility] yet. Staff advised patient that she will still be transferred however, it is now shift change which has slowed things down however we are still attempting to get her transferred.

## 2024-03-26 NOTE — Progress Notes (Signed)
 Report called [facility] to Kalen RN for transfer.

## 2024-03-26 NOTE — Progress Notes (Signed)
 Called [facility] to give report, and callback number requested from Charge RN that she will call back.  Waiting for Charge RN to give report.

## 2024-03-26 NOTE — ED Notes (Signed)
 Pt presented to [facility] voluntarily and admitted to continuous assessment unit w/ c/o SI. Hx includes: PTSD, MDD. Pt was having her virtual appointment w/ her therapist when she reported SI w/ intent. She considered a suicidal attempt while the kids are safe at school. Denies SI/HI/VH. Endorses AH. Pt depressed, calm and cooperative. Skin assessment: unremarkable . Oriented to unit. Food and drink provided. Denies need of anything at this time. Pt in NAD at this time. Encouragement and support given. Will continue to monitor.
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
## 2024-03-27 ENCOUNTER — Encounter (HOSPITAL_COMMUNITY): Payer: Self-pay

## 2024-03-27 ENCOUNTER — Encounter (HOSPITAL_COMMUNITY): Payer: Self-pay | Admitting: Psychiatry

## 2024-03-27 ENCOUNTER — Other Ambulatory Visit: Payer: Self-pay

## 2024-03-27 DIAGNOSIS — F332 Major depressive disorder, recurrent severe without psychotic features: Secondary | ICD-10-CM | POA: Diagnosis not present

## 2024-03-27 MED ORDER — MIRTAZAPINE 15 MG PO TABS
15.0000 mg | ORAL_TABLET | Freq: Every day | ORAL | Status: DC
  Administered 2024-03-27 – 2024-03-29 (×3): 15 mg via ORAL
  Filled 2024-03-27 (×3): qty 1

## 2024-03-27 MED ORDER — ALBUTEROL SULFATE HFA 108 (90 BASE) MCG/ACT IN AERS: 1.0000 | INHALATION_SPRAY | Freq: Four times a day (QID) | RESPIRATORY_TRACT | Status: DC | PRN

## 2024-03-27 MED ORDER — LAMOTRIGINE 100 MG PO TABS: 100.0000 mg | ORAL_TABLET | Freq: Every day | ORAL | Status: DC

## 2024-03-27 MED ORDER — PRAZOSIN HCL 2 MG PO CAPS
5.0000 mg | ORAL_CAPSULE | Freq: Every day | ORAL | Status: DC
  Administered 2024-03-27 – 2024-03-31 (×5): 5 mg via ORAL
  Filled 2024-03-27 (×5): qty 1

## 2024-03-27 MED ORDER — GABAPENTIN 300 MG PO CAPS
300.0000 mg | ORAL_CAPSULE | Freq: Two times a day (BID) | ORAL | Status: DC
  Administered 2024-03-27 – 2024-04-01 (×9): 300 mg via ORAL
  Filled 2024-03-27 (×10): qty 1

## 2024-03-27 MED ORDER — LAMOTRIGINE 100 MG PO TABS
150.0000 mg | ORAL_TABLET | Freq: Every day | ORAL | Status: DC
  Administered 2024-03-27: 150 mg via ORAL
  Filled 2024-03-27: qty 2

## 2024-03-27 MED ORDER — NIFEDIPINE ER OSMOTIC RELEASE 30 MG PO TB24
60.0000 mg | ORAL_TABLET | Freq: Every day | ORAL | Status: DC
  Administered 2024-03-27 – 2024-04-01 (×6): 60 mg via ORAL
  Filled 2024-03-27 (×6): qty 2

## 2024-03-27 MED ORDER — SERTRALINE HCL 25 MG PO TABS
25.0000 mg | ORAL_TABLET | Freq: Every day | ORAL | Status: DC
  Administered 2024-03-27: 25 mg via ORAL
  Filled 2024-03-27: qty 1

## 2024-03-27 MED ORDER — LISDEXAMFETAMINE DIMESYLATE 10 MG PO CAPS
20.0000 mg | ORAL_CAPSULE | Freq: Every morning | ORAL | Status: DC
  Administered 2024-03-27: 20 mg via ORAL
  Filled 2024-03-27: qty 2

## 2024-03-27 MED ORDER — LAMOTRIGINE 100 MG PO TABS
200.0000 mg | ORAL_TABLET | Freq: Every day | ORAL | Status: DC
  Administered 2024-03-28 – 2024-04-01 (×5): 200 mg via ORAL
  Filled 2024-03-27 (×5): qty 2

## 2024-03-27 MED ORDER — ONDANSETRON 4 MG PO TBDP
4.0000 mg | ORAL_TABLET | Freq: Three times a day (TID) | ORAL | Status: DC | PRN
  Administered 2024-03-27 – 2024-03-28 (×2): 4 mg via ORAL
  Filled 2024-03-27 (×2): qty 1

## 2024-03-27 MED ORDER — GABAPENTIN 300 MG PO CAPS: 300.0000 mg | ORAL_CAPSULE | Freq: Every day | ORAL | Status: DC

## 2024-03-27 NOTE — Progress Notes (Signed)

## 2024-03-27 NOTE — Progress Notes (Signed)
 Admission Note: report received from Tanara RN patient is a 66 VOL year old female from  Pt was calm and cooperative during assessment. Denies SI/HI/AVH. Admission plan of care reviewed with pt, consent signed.  Personal belongings/skin assessment completed.   No contraband found.  Patient oriented to the unit, staff and room.  Routine safety checks initiated.  Verbalizes understanding of unit rules/protocols.   Patient is presently safe on the unit. No unsafe behaviors noted.  Q 15 minute safety checks maintained per unit protocol.

## 2024-03-27 NOTE — Progress Notes (Signed)
   03/27/24 0902  Psych Admission Type (Psych Patients Only)  Admission Status Voluntary  Psychosocial Assessment  Patient Complaints Anxiety;Worrying  Eye Contact Fair  Facial Expression Anxious  Affect Appropriate to circumstance  Speech Logical/coherent  Interaction Assertive  Motor Activity Other (Comment) (WDL)  Appearance/Hygiene In hospital gown  Behavior Characteristics Cooperative;Appropriate to situation  Mood Anxious;Pleasant  Thought Process  Coherency WDL  Content WDL  Delusions None reported or observed  Perception WDL  Hallucination None reported or observed  Judgment Poor  Confusion None  Danger to Self  Current suicidal ideation? Denies  Agreement Not to Harm Self Yes  Description of Agreement Verbal  Danger to Others  Danger to Others None reported or observed

## 2024-03-27 NOTE — Group Note (Signed)
 Date:  03/27/2024 Time:  9:25 AM  Group Topic/Focus:  Goals Group:   The focus of this group is to help patients establish daily goals to achieve during treatment and discuss how the patient can incorporate goal setting into their daily lives to aide in recovery. Orientation:   The focus of this group is to educate the patient on the purpose and policies of crisis stabilization and provide a format to answer questions about their admission.  The group details unit policies and expectations of patients while admitted.  To create a supportive and motivating environment where members can clarify and set meaningful personal and professional goals, understand how fulfilling foundational needs (as outlined in Maslow's hierarchy) supports their growth, utilize positive affirmations to build self-confidence and resilience, and foster continuous personal development and self-actualization.  Participation Level:  Did Not Attend  Elizabeth Cameron 03/27/2024, 9:25 AM

## 2024-03-27 NOTE — Tx Team (Addendum)
 Initial Treatment Plan 03/27/2024 4:31 AM Eniola Lilian FMW:[id number]    PATIENT STRESSORS: Other: Mental Illness (MDD)     PATIENT STRENGTHS: Capable of independent living  Communication skills    PATIENT IDENTIFIED PROBLEMS: Depression (denies SI) endorses she was misunderstood                      DISCHARGE CRITERIA:  Improved stabilization in mood, thinking, and/or behavior  PRELIMINARY DISCHARGE PLAN: Return to previous living arrangement  PATIENT/FAMILY INVOLVEMENT: This treatment plan has been presented to and reviewed with the patient, Elizabeth Cameron, and/or family member, .  The patient and family have been given the opportunity to ask questions and make suggestions.  Adelaide Pfefferkorn W Lerae Langham, RN 03/27/2024, 4:31 AM

## 2024-03-27 NOTE — Progress Notes (Signed)
(  Sleep Hours) -8.5   (Any PRNs that were needed, meds refused, or side effects to meds)- Vistaril 25 mg  (Any disturbances and when (visitation, over night)-none  (Concerns raised by the patient)- none  (SI/HI/AVH)-denies

## 2024-03-27 NOTE — Group Note (Signed)
 Date:  03/27/2024 Time:  8:15 PM  Group Topic/Focus:  Wrap-Up Group:   The focus of this group is to help patients review their daily goal of treatment and discuss progress on daily workbooks.    Participation Level:  Active  Participation Quality:  Appropriate  Affect:  Appropriate  Cognitive:  n/a  Insight: None  Engagement in Group:  None  Modes of Intervention:  Discussion  Additional Comments:   Pt attended NA meeting  Elizabeth Cameron A Elizabeth Cameron 03/27/2024, 8:15 PM

## 2024-03-27 NOTE — Group Note (Signed)
 Recreation Therapy Group Note   Group Topic:Leisure Education  Group Date: 03/27/2024 Start Time: 0933 End Time: 1020 Facilitators: Niel Peretti-McCall, LRT,CTRS Location: [address] Dayroom   Group Topic: Leisure Education  Goal Area(s) Addresses:  Patient will identify positive leisure activities for use post discharge. Patient will identify at least one positive benefit of participation in leisure activities.  Patient will work effectively work with peer by sharing ideas and contributing to Social worker.  Behavioral Response:    Intervention: Innovation, Group Presentation   Activity: In pairs, patients were asked to create a community based program with their teammate. Team's were tasked with designing a program, including a Name, Demographic, Age group, Benefits, Hours of operation and What is offered.  Education:  Leisure Scientist, physiological, Special educational needs teacher, Teamwork, Discharge Planning  Education Outcome: Acknowledges education/In group clarification offered/Needs additional education.    Affect/Mood: N/A   Participation Level: Did not attend    Clinical Observations/Individualized Feedback:      Plan: Continue to engage patient in RT group sessions 2-3x/week.   Brennley Curtice-McCall, LRT,CTRS 03/27/2024 12:08 PM

## 2024-03-27 NOTE — H&P (Addendum)
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 N/A HbAIC: N/A HCG: Urine pregnancy test negative UDS: Negative for substances UA: Colitis cloudy, protein 30, bacteria many. Vitamin B-12: NA  Psychotherapy: Therapeutic milieu  Medications: See MAR  Consultations: Pending  Discharge Concerns: Safety  Estimated LOS: 3 to 7 days  Other:     Assessment: Saory Carriero is a 28 year old African-American female with prior psychiatric diagnoses significant for MDD recurrent severe, bipolar affective disorder mixed severe, cannabis  abuse, ADHD combined type, oppositional defiant disorder, PTSD, and insomnia.  Patient presents to [facility] from [facility] behavioral health urgent care for worsening depression resulting in suicidal  ideation with specific plans to harm herself when her 3 children are at school in the context of multi-family deaths within the past 5 years.  BAL: Less than 15, UDS: Negative for any substances.  After medical evaluation, stabilization, and clearance, patient was transferred to [facility] for further psychiatric evaluation and treatment.  Physician Treatment Plan for Primary Diagnosis: MDD (major depressive disorder), recurrent episode, severe (HCC)  Plans: Medications: Resume home medications --Continue Lamictal 200 mg p.o. daily for mood stabilization --Discontinue Vyvanse  20 mg capsule p.o. daily for ADHD --Continue Remeron 15 mg p.o. daily for depression and sleep --Continue prazosin 5 mg capsule p.o. daily at bedtime for weird dreams -- Continue gabapentin 300 mg tablet p.o. twice daily -- Continue trazodone tablet 50 mg p.o. q. nightly at bedtime for insomnia -- Continue hydroxyzine  tablet 25 mg p.o. 3 times daily as needed for anxiety -- Initiate Zoloft 25 mg tablet p.o. daily at bedtime for depression and anxiety  Continue BH Agitation Protocol   Medications for other medical conditions: --Continue ventilator HFA 108 90 base micrograms/ACT inhaler 1 to 2 puffs every 6 hours as needed for wheezing shortness of breath --Continue Procardia XL 24-hour tablet 60 mg p.o. daily for high blood pressure   Other PRN Medications  -Acetaminophen  650 mg every 6 as needed/mild pain  -Maalox 30 mL oral every 4 as needed/digestion  -Magnesium hydroxide 30 mL daily as needed/mild constipation  --Zofran  disintegrating tab 4 mg p.o. every 8 hours as needed for nausea and vomiting  --The risks/benefits/side-effects/alternatives to this medication were discussed in detail with  the patient and time was given for questions. The patient consents to medication trial.   -- Metabolic profile and EKG monitoring obtained while on an atypical antipsychotic (BMI: Lipid Panel: HbgA1c: QTc:)   -- Encouraged patient to participate in unit milieu and in scheduled group therapies   Safety and Monitoring:  Voluntary admission to inpatient psychiatric unit for safety, stabilization and treatment  Daily contact with patient to assess and evaluate symptoms and progress in treatment  Patient's case to be discussed in multi-disciplinary team meeting  Observation Level : q15 minute checks  Vital signs: q12 hours  Precautions: suicide, but pt currently verbally contracts for safety on unit?   Discharge Planning:  Social work and case management to assist with discharge planning and identification of hospital follow-up needs prior to discharge  Estimated LOS: 5-7?days  Discharge Concerns: Need to establish Safety plan; Medication compliance and effectiveness  Discharge Goals: Return home with outpatient referrals for mental health follow-up including medication management/psychotherapy.   Long Term Goal(s): Improvement in symptoms so as ready for discharge  Short Term Goals: Ability to identify changes in lifestyle to reduce recurrence of condition will improve, Ability to verbalize feelings will improve, Ability to disclose and discuss suicidal ideas, Ability to demonstrate self-control will improve, Ability to identify and develop effective coping behaviors will improve, Ability to maintain clinical measurements within normal limits will improve, Compliance with prescribed medications will improve, and Ability to identify triggers associated with substance abuse/mental health issues will improve  Physician Treatment Plan for Secondary Diagnosis: Principal Problem:   MDD (major depressive disorder), recurrent episode, severe (HCC)  I certify that inpatient services furnished can  reasonably be expected to improve the patient's condition.    Ellouise JAYSON Azure, FNP 10/22/20252:00 PM
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
## 2024-03-27 NOTE — Group Note (Signed)
 Date:  03/27/2024 Time:  3:59 PM  Group Topic/Focus: Spiritual Wellness   Pt did not attend spiritual wellness group  Lauris JONELLE Morales 03/27/2024, 3:59 PM

## 2024-03-27 NOTE — Plan of Care (Signed)
   Problem: Education: Goal: Emotional status will improve Outcome: Progressing Goal: Mental status will improve Outcome: Progressing Goal: Verbalization of understanding the information provided will improve Outcome: Progressing

## 2024-03-27 NOTE — Group Note (Signed)
 Date:  03/27/2024 Time:  10:39 AM  Group Topic/Focus: Recreation Therapy   Pt did not attend recreation therapy group.  Merilee Wible R Jayce Boyko 03/27/2024, 10:39 AM

## 2024-03-27 NOTE — Plan of Care (Signed)
   Problem: Education: Goal: Emotional status will improve Outcome: Progressing Goal: Mental status will improve Outcome: Progressing   Problem: Activity: Goal: Interest or engagement in activities will improve Outcome: Progressing Goal: Sleeping patterns will improve Outcome: Progressing   Problem: Safety: Goal: Periods of time without injury will increase Outcome: Progressing

## 2024-03-27 NOTE — Plan of Care (Signed)
   Problem: Education: Goal: Knowledge of [facility] Education information/materials will improve Outcome: Progressing Goal: Emotional status will improve Outcome: Progressing Goal: Mental status will improve Outcome: Progressing Goal: Verbalization of understanding the information provided will improve Outcome: Progressing

## 2024-03-27 NOTE — Group Note (Signed)
 Date:  03/27/2024 Time:  5:05 PM  Group Topic/Focus:  Coping With Mental Health Crisis:   The purpose of this group is to help patients identify strategies for coping with mental health crisis.  Group discusses possible causes of crisis and ways to manage them effectively. Developing a Wellness Toolbox:   The focus of this group is to help patients develop a wellness toolbox with skills and strategies to promote recovery upon discharge.    Participation Level:  Did Not Attend  Participation Quality:    Affect:    Cognitive:    Insight:   Engagement in Group:    Modes of Intervention:    Additional Comments:    Elizabeth Cameron Metro 03/27/2024, 5:05 PM

## 2024-03-27 NOTE — BH IP Treatment Plan (Signed)
 Interdisciplinary Treatment and Diagnostic Plan Update  03/27/2024 Time of Session: 1025am Elizabeth Cameron MRN: [id number]  Principal Diagnosis: MDD (major depressive disorder), recurrent episode, severe (HCC)  Secondary Diagnoses: Principal Problem:   MDD (major depressive disorder), recurrent episode, severe (HCC)   Current Medications:  Current Facility-Administered Medications  Medication Dose Route Frequency Provider Last Rate Last Admin   haloperidol (HALDOL) tablet 5 mg  5 mg Oral TID PRN Onuoha, Chinwendu V, NP       And   diphenhydrAMINE (BENADRYL) capsule 50 mg  50 mg Oral TID PRN Onuoha, Chinwendu V, NP       haloperidol lactate (HALDOL) injection 5 mg  5 mg Intramuscular TID PRN Onuoha, Chinwendu V, NP       And   diphenhydrAMINE (BENADRYL) injection 50 mg  50 mg Intramuscular TID PRN Onuoha, Chinwendu V, NP       And   LORazepam (ATIVAN) injection 2 mg  2 mg Intramuscular TID PRN Onuoha, Chinwendu V, NP       haloperidol lactate (HALDOL) injection 10 mg  10 mg Intramuscular TID PRN Onuoha, Chinwendu V, NP       And   diphenhydrAMINE (BENADRYL) injection 50 mg  50 mg Intramuscular TID PRN Onuoha, Chinwendu V, NP       And   LORazepam (ATIVAN) injection 2 mg  2 mg Intramuscular TID PRN Onuoha, Chinwendu V, NP       hydrOXYzine  (ATARAX ) tablet 25 mg  25 mg Oral TID PRN Onuoha, Chinwendu V, NP   25 mg at 03/27/24 9060   ondansetron  (ZOFRAN -ODT) disintegrating tablet 4 mg  4 mg Oral Q8H PRN Ntuen, Tina C, FNP   4 mg at 03/27/24 1014   traZODone (DESYREL) tablet 50 mg  50 mg Oral QHS PRN Onuoha, Chinwendu V, NP   50 mg at 03/26/24 2330   PTA Medications: Medications Prior to Admission  Medication Sig Dispense Refill Last Dose/Taking   albuterol  (VENTOLIN  HFA) 108 (90 Base) MCG/ACT inhaler Inhale 1-2 puffs into the lungs every 6 (six) hours as needed for wheezing or shortness of breath. 18 g 0 03/24/2024   etonogestrel -ethinyl estradiol  (HALOETTE) 0.12-0.015 MG/24HR vaginal  ring Place 1 each vaginally every 28 (twenty-eight) days.   Past Month   gabapentin (NEURONTIN) 300 MG capsule Take 300 mg by mouth at bedtime.   03/25/2024   lamoTRIgine (LAMICTAL) 150 MG tablet Take 150 mg by mouth daily.   03/25/2024   mirtazapine (REMERON) 15 MG tablet Take 15 mg by mouth at bedtime.   03/25/2024   NIFEdipine (ADALAT CC) 60 MG 24 hr tablet Take 60 mg by mouth daily.   03/25/2024   prazosin (MINIPRESS) 5 MG capsule Take 5 mg by mouth at bedtime.   03/25/2024   VYVANSE  20 MG capsule Take 20 mg by mouth every morning.   03/25/2024   ALPRAZolam (XANAX) 1 MG tablet Take 1 mg by mouth 3 (three) times daily. (Patient not taking: Reported on 03/27/2024)   Not Taking    Patient Stressors: Other: Mental Illness (MDD)    Patient Strengths: Capable of independent living  Communication skills   Treatment Modalities: Medication Management, Group therapy, Case management,  1 to 1 session with clinician, Psychoeducation, Recreational therapy.   Physician Treatment Plan for Primary Diagnosis: MDD (major depressive disorder), recurrent episode, severe (HCC) Long Term Goal(s): Improvement in symptoms so as ready for discharge   Short Term Goals: Ability to identify changes in lifestyle to reduce recurrence of condition will  improve Ability to verbalize feelings will improve Ability to disclose and discuss suicidal ideas Ability to demonstrate self-control will improve Ability to identify and develop effective coping behaviors will improve Ability to maintain clinical measurements within normal limits will improve Compliance with prescribed medications will improve Ability to identify triggers associated with substance abuse/mental health issues will improve  Medication Management: Evaluate patient's response, side effects, and tolerance of medication regimen.  Therapeutic Interventions: 1 to 1 sessions, Unit Group sessions and Medication administration.  Evaluation of Outcomes:  Not Progressing  Physician Treatment Plan for Secondary Diagnosis: Principal Problem:   MDD (major depressive disorder), recurrent episode, severe (HCC)  Long Term Goal(s): Improvement in symptoms so as ready for discharge   Short Term Goals: Ability to identify changes in lifestyle to reduce recurrence of condition will improve Ability to verbalize feelings will improve Ability to disclose and discuss suicidal ideas Ability to demonstrate self-control will improve Ability to identify and develop effective coping behaviors will improve Ability to maintain clinical measurements within normal limits will improve Compliance with prescribed medications will improve Ability to identify triggers associated with substance abuse/mental health issues will improve     Medication Management: Evaluate patient's response, side effects, and tolerance of medication regimen.  Therapeutic Interventions: 1 to 1 sessions, Unit Group sessions and Medication administration.  Evaluation of Outcomes: Not Progressing   RN Treatment Plan for Primary Diagnosis: MDD (major depressive disorder), recurrent episode, severe (HCC) Long Term Goal(s): Knowledge of disease and therapeutic regimen to maintain health will improve  Short Term Goals: Ability to remain free from injury will improve, Ability to verbalize frustration and anger appropriately will improve, Ability to demonstrate self-control, Ability to participate in decision making will improve, Ability to verbalize feelings will improve, Ability to disclose and discuss suicidal ideas, Ability to identify and develop effective coping behaviors will improve, and Compliance with prescribed medications will improve  Medication Management: RN will administer medications as ordered by provider, will assess and evaluate patient's response and provide education to patient for prescribed medication. RN will report any adverse and/or side effects to prescribing  provider.  Therapeutic Interventions: 1 on 1 counseling sessions, Psychoeducation, Medication administration, Evaluate responses to treatment, Monitor vital signs and CBGs as ordered, Perform/monitor CIWA, COWS, AIMS and Fall Risk screenings as ordered, Perform wound care treatments as ordered.  Evaluation of Outcomes: Not Progressing   LCSW Treatment Plan for Primary Diagnosis: MDD (major depressive disorder), recurrent episode, severe (HCC) Long Term Goal(s): Safe transition to appropriate next level of care at discharge, Engage patient in therapeutic group addressing interpersonal concerns.  Short Term Goals: Engage patient in aftercare planning with referrals and resources, Increase social support, Increase ability to appropriately verbalize feelings, Increase emotional regulation, Facilitate acceptance of mental health diagnosis and concerns, Facilitate patient progression through stages of change regarding substance use diagnoses and concerns, Identify triggers associated with mental health/substance abuse issues, and Increase skills for wellness and recovery  Therapeutic Interventions: Assess for all discharge needs, 1 to 1 time with Social worker, Explore available resources and support systems, Assess for adequacy in community support network, Educate family and significant other(s) on suicide prevention, Complete Psychosocial Assessment, Interpersonal group therapy.  Evaluation of Outcomes: Not Progressing   Progress in Treatment: Attending groups: No. Participating in groups: No. Taking medication as prescribed: none scheduled Toleration medication: NA Family/Significant other contact made: No, will contact:  consents pending Patient understands diagnosis: Yes. Discussing patient identified problems/goals with staff: Yes. Medical problems stabilized or resolved: Yes.  Denies suicidal/homicidal ideation: Yes. Issues/concerns per patient self-inventory: No.  New problem(s)  identified: No, Describe:  none  New Short Term/Long Term Goal(s): medication stabilization, elimination of SI thoughts, development of comprehensive mental wellness plan.    Patient Goals:  My therapist took out of context what I was saying, we were talking about my Grandfathers passing. The only thing I need is fix the dose on my mood stabilizer  Discharge Plan or Barriers: Patient recently admitted. CSW will continue to follow and assess for appropriate referrals and possible discharge planning.    Reason for Continuation of Hospitalization: Depression Medication stabilization  Estimated Length of Stay: 5-7 days  Last 3 [place] Suicide Severity Risk Score: Flowsheet Row [facility] Most recent reading at 03/27/2024  3:00 AM ED from 03/26/2024 in [facility] Most recent reading at 03/26/2024  4:27 PM UC from 01/26/2024 in [facility] [place] Most recent reading at 01/26/2024  3:19 PM  C-SSRS RISK CATEGORY High Risk High Risk No Risk    Last PHQ 2/9 Scores:    05/25/2022   10:39 AM  Depression screen PHQ 2/9  Decreased Interest 0  Down, Depressed, Hopeless 0  PHQ - 2 Score 0  Altered sleeping 0  Tired, decreased energy 0  Change in appetite 1  Feeling bad or failure about yourself  1  Trouble concentrating 1  Moving slowly or fidgety/restless 1  Suicidal thoughts 0  PHQ-9 Score 4    Scribe for Treatment Team: Jenkins LULLA Morley ISRAEL 03/27/2024 12:15 PM

## 2024-03-27 NOTE — Progress Notes (Signed)
(  Sleep Hours) - 2 hrs (Any PRNs that were needed, meds refused, or side effects to meds)- Trazodone hydroxyzine  3times (Any disturbances and when (visitation, over night)- none (Concerns raised by the patient)- none (SI/HI/AVH)- denies

## 2024-03-27 NOTE — ED Provider Notes (Signed)
 [facility] ASAP Discharge Summary  Date and Time: 03/27/2024 10:27 AM  Name: Elizabeth Cameron  MRN:  [id number]   Discharge Diagnoses:  Final diagnoses:  Severe episode of recurrent major depressive disorder, without psychotic features (HCC)  Posttraumatic stress disorder  Insomnia, unspecified type   HPI: Patient presents via [facility] following a virtual meeting with her therapist at [facility]. During the meeting patient voiced SI, and expressed intent while the kids are safe at school. Patient is tearful throughout triage, appearing distressed! She voices SI, admitting she has considered suicide, and again mentions she considered an attempt while the kids are in school. She no longer identifies the kids as protective factors, stating, They will be okay and in 5 years or so, they can move on. She also relays she has considered the financial struggle for someone to care for her 3 children(ages 11,7 and 9) by obtaining life insurance last week. Patient reports multiple stresors, to include many deaths in her family over the past 5 years. She reports PTSD symptoms after havinig to be the one to identify her brother when he died, stating, They pulled the drawer out with him there. Patient states several times, she is just done and it's too much throughout triage. She then attempts to affirm her safety, expressing concern that her mother is the only person to care for her kids and her mother has recently had knee surgery. Patient is unable to reliably affirm her safety. She denies HI, AVH or SA hx.    I feel like something is always going to happen. Patient medication managed by PCP Johnston Olea. Patient cannot express clear understanding of psychiatric/somatic medication regimen or utility. She is likely on lamotrigine and prazosin. She often forgets to take her nifedipine (HTN). Her appetite has diminished and she becomes nauseated when taking certain meds without food.   Stay Summary: Patient  admitted to [facility] due to lack of an immediately available inpatient psychiatric bed and pending labs. Patient was calm, cooperative but dysthymic. She was cleared for inpatient admission and transitioned ~2200 from [facility] to [facility].  Past Psychiatric History: h/o ODD, ADHD, cannabis use disorder    Alcohol/Drug Use: Alcohol / Drug Use Pain Medications: See MAR Prescriptions: See MAR Over the Counter: See MAR History of alcohol / drug use?: distant h/o cannabis use  Social History: Marital status: Single Does patient have children?: Yes How many children?: 3 How is patient's relationship with their children?: Patient has 3 children, ages 64,9 and 22.  Patient reports good relationships with children.  Current Medications:  No current facility-administered medications for this encounter.   No current outpatient medications on file.   Facility-Administered Medications Ordered in Other Encounters  Medication Dose Route Frequency Provider Last Rate Last Admin   haloperidol (HALDOL) tablet 5 mg  5 mg Oral TID PRN Onuoha, Chinwendu V, NP       And   diphenhydrAMINE (BENADRYL) capsule 50 mg  50 mg Oral TID PRN Onuoha, Chinwendu V, NP       haloperidol lactate (HALDOL) injection 5 mg  5 mg Intramuscular TID PRN Onuoha, Chinwendu V, NP       And   diphenhydrAMINE (BENADRYL) injection 50 mg  50 mg Intramuscular TID PRN Onuoha, Chinwendu V, NP       And   LORazepam (ATIVAN) injection 2 mg  2 mg Intramuscular TID PRN Onuoha, Chinwendu V, NP       haloperidol lactate (HALDOL) injection 10 mg  10 mg Intramuscular TID  PRN Onuoha, Chinwendu V, NP       And   diphenhydrAMINE (BENADRYL) injection 50 mg  50 mg Intramuscular TID PRN Onuoha, Chinwendu V, NP       And   LORazepam (ATIVAN) injection 2 mg  2 mg Intramuscular TID PRN Onuoha, Chinwendu V, NP       hydrOXYzine  (ATARAX ) tablet 25 mg  25 mg Oral TID PRN Onuoha, Chinwendu V, NP   25 mg at 03/27/24 9060   ondansetron  (ZOFRAN -ODT) disintegrating  tablet 4 mg  4 mg Oral Q8H PRN Ntuen, Tina C, FNP   4 mg at 03/27/24 1014   traZODone (DESYREL) tablet 50 mg  50 mg Oral QHS PRN Onuoha, Chinwendu V, NP   50 mg at 03/26/24 2330    PTA Medications:  Facility Ordered Medications  Medication   haloperidol (HALDOL) tablet 5 mg   And   diphenhydrAMINE (BENADRYL) capsule 50 mg   haloperidol lactate (HALDOL) injection 5 mg   And   diphenhydrAMINE (BENADRYL) injection 50 mg   And   LORazepam (ATIVAN) injection 2 mg   haloperidol lactate (HALDOL) injection 10 mg   And   diphenhydrAMINE (BENADRYL) injection 50 mg   And   LORazepam (ATIVAN) injection 2 mg   traZODone (DESYREL) tablet 50 mg   hydrOXYzine  (ATARAX ) tablet 25 mg   ondansetron  (ZOFRAN -ODT) disintegrating tablet 4 mg   PTA Medications  Medication Sig   albuterol  (VENTOLIN  HFA) 108 (90 Base) MCG/ACT inhaler Inhale 1-2 puffs into the lungs every 6 (six) hours as needed for wheezing or shortness of breath.   NIFEdipine (ADALAT CC) 60 MG 24 hr tablet Take 60 mg by mouth daily.   prazosin (MINIPRESS) 5 MG capsule Take 5 mg by mouth at bedtime.   ALPRAZolam (XANAX) 1 MG tablet Take 1 mg by mouth 3 (three) times daily. (Patient not taking: Reported on 03/27/2024)   VYVANSE  20 MG capsule Take 20 mg by mouth every morning.   gabapentin (NEURONTIN) 300 MG capsule Take 300 mg by mouth at bedtime.   lamoTRIgine (LAMICTAL) 150 MG tablet Take 150 mg by mouth daily.   mirtazapine (REMERON) 15 MG tablet Take 15 mg by mouth at bedtime.   etonogestrel -ethinyl estradiol  (HALOETTE) 0.12-0.015 MG/24HR vaginal ring Place 1 each vaginally every 28 (twenty-eight) days.       05/25/2022   10:39 AM  Depression screen PHQ 2/9  Decreased Interest 0  Down, Depressed, Hopeless 0  PHQ - 2 Score 0  Altered sleeping 0  Tired, decreased energy 0  Change in appetite 1  Feeling bad or failure about yourself  1  Trouble concentrating 1  Moving slowly or fidgety/restless 1  Suicidal thoughts 0  PHQ-9  Score 4    Flowsheet Row [facility]Current[facility] Most recent reading at 03/27/2024  3:00 AM ED from 03/26/2024 in [facility] Most recent reading at 03/26/2024  4:27 PM UC from 01/26/2024 in [facility] [place] Most recent reading at 01/26/2024  3:19 PM  C-SSRS RISK CATEGORY High Risk High Risk No Risk    Musculoskeletal  Strength & Muscle Tone: within normal limits Gait & Station: normal Patient leans: N/A  Psychiatric Specialty Exam  Presentation  General Appearance:  Appropriate for Environment  Eye Contact: Fleeting  Speech: Slow  Speech Volume: Decreased  Handedness: Right   Mood and Affect  Mood: Depressed  Affect: Congruent   Thought Process  Thought  Processes: Coherent; Goal Directed  Descriptions of Associations:Intact  Orientation:Full (Time, Place and Person)  Thought Content:Logical  Diagnosis of Schizophrenia or Schizoaffective disorder in past: No    Hallucinations:Hallucinations: None  Ideas of Reference:None  Suicidal Thoughts:Suicidal Thoughts: Yes, Active SI Active Intent and/or Plan: With Intent  Homicidal Thoughts:Homicidal Thoughts: No   Sensorium  Memory: Immediate Fair; Recent Fair; Remote Fair  Judgment: Impaired  Insight: Fair   Chartered certified accountant: Fair  Attention Span: Fair  Recall: [employer] of Knowledge: Fair  Language: Fair   Psychomotor Activity  Psychomotor Activity: Psychomotor Activity: Decreased   Assets  Assets: Manufacturing systems engineer; Desire for Improvement; Housing; Resilience   Sleep  Sleep:No data recorded Estimated Sleeping Duration (Last 24 Hours): 0.25 hours  Nutritional Assessment (For OBS and [facility] admissions only) Has the patient recently lost weight without trying?: 0 Has the patient been eating poorly because of a decreased appetite?:  0 Malnutrition Screening Tool Score: 0    Physical Exam  Physical Exam ROS Blood pressure 133/85, pulse 67, temperature (!) 97.3 F (36.3 C), resp. rate 17, SpO2 100%. There is no height or weight on file to calculate BMI.  Demographic Factors:  Unemployed  Loss Factors: Financial problems/change in socioeconomic status  Historical Factors: Family history of mental illness or substance abuse  Risk Reduction Factors:   Responsible for children under 71 years of age  Continued Clinical Symptoms:  More than one psychiatric diagnosis  Cognitive Features That Contribute To Risk:  None    Suicide Risk:  Moderate:  Frequent suicidal ideation with limited intensity, and duration, some specificity in terms of plans, no associated intent, good self-control, limited dysphoria/symptomatology, some risk factors present, and identifiable protective factors, including available and accessible social support.  Plan Of Care/Follow-up recommendations:  Optimize psychiatric regimen. Neither CCM nor patient provide clarity with regards to current, consistent medication use (prazosin 5mg  at bedtime, lamotrigine 150mg  every day?, mirtazapine, gabapenin, Vyvanse , nifedipine, gemfibrozil). 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 ADHD unclear. Chronic health issues include dyslipidemia, possibly early renal disease, and overweight.  Disposition: Discharge for admission to [facility].  KANDI JAYSON HAHN, MD 03/27/2024, 10:27 AM

## 2024-03-28 MED ORDER — SERTRALINE HCL 50 MG PO TABS
50.0000 mg | ORAL_TABLET | Freq: Every day | ORAL | Status: DC
  Administered 2024-03-29 – 2024-03-30 (×2): 50 mg via ORAL
  Filled 2024-03-28 (×2): qty 1

## 2024-03-28 MED ORDER — SERTRALINE HCL 25 MG PO TABS: 25.0000 mg | ORAL_TABLET | Freq: Every day | ORAL | Status: DC

## 2024-03-28 MED ORDER — SERTRALINE HCL 25 MG PO TABS
25.0000 mg | ORAL_TABLET | Freq: Every day | ORAL | Status: DC
  Administered 2024-03-28: 25 mg via ORAL
  Filled 2024-03-28: qty 1

## 2024-03-28 NOTE — Group Note (Signed)
 LCSW Group Therapy Note   Group Date: 03/28/2024 Start Time: 1100 End Time: 1200   Participation:  patient was present and actively participated in the discussion.  Type of Therapy:  Group Therapy  Topic:  Finding Balance: Using Wise Mind for Thoughtful Decisions  Objective:  To help participants understand and apply the concept of [place] Mind to make balanced, thoughtful decisions by integrating emotion and logic.  Goals: Learn the differences between Emotional Mind, Reasonable Mind, and [employer]. Recognize personal signs of Emotional and Reasonable Mind. Practice using [employer] in real-life scenarios.  Summary:  This class focused on Wise Mind--DBT's concept of balancing Emotional Mind and Reasonable Mind. We identified when we're in each state and practiced using Wise Mind to respond thoughtfully in real-life situations. By combining emotion and logic, participants can improve decision-making, manage challenges, and enhance relationships.  Therapeutic Modalities: Elements of Dialectical Behavior Therapy (DBT):  Mindfulness (noticing thoughts and emotions without judgment), Emotion Regulation (understanding and managing emotional responses), Distress Tolerance (coping with difficult situations without making them worse), Wise Mind (integrating emotion and reason for balanced decision-making) Elements of Cognitive Behavioral Therapy (CBT):  Identifying automatic thoughts, Challenging cognitive distortions, Using logic to reframe unhelpful thinking patterns   Catherene MALVA Dynes, LCSWA 03/28/2024  12:12 PM

## 2024-03-28 NOTE — Plan of Care (Signed)

## 2024-03-28 NOTE — Progress Notes (Addendum)
 Tour of Duty:  Prentice JINNY Angle, RN, 03/28/24, Tour of Duty: [phone]  SI/HI/AVH: Denies  Self-Reported   Mood: Neutral  Anxiety: Endorses Depression: Denies Irritability: Denies  Broset  Violence Prevention Guidelines *See Row Information*: (not recorded)   LBM  Last BM Date : (not recorded)   Pain: not present  Patient Refusals (including Rx): No  >>Shift Summary: Patient observed to be mildly anxious on unit. Patient able to make needs known. Patient observed to engage appropriately with staff and peers. Patient taking medications as prescribed. This shift, no PRN medication requested or required. No reported or observed side effects to medication. No reported or observed agitation, aggression, or other acute emotional distress. No reported or observed physical abnormalities or concerns. Patient concerned about prescribed Rx, education provided, patient agreeable to course of treatment.  Last Vitals   Vitals Weight: (not recorded) Temp: 98.8 F (37.1 C) Temp Source: Oral Pulse Rate: 96 Resp: 16 BP: 107/71 Patient Position: (not recorded)  Admission Type  Psych Admission Type (Psych Patients Only) Admission Status: Voluntary Date 72 hour document signed : (not recorded) Time 72 hour document signed : (not recorded) Provider Notified (First and Last Name) (see details for LINK to note): (not recorded)   Psychosocial Assessment  Psychosocial Assessment Patient Complaints: Anxiety Eye Contact: Fair Facial Expression: Anxious Affect: Appropriate to circumstance Speech: Logical/coherent Interaction: Assertive Motor Activity: Other (Comment) (WDL) Appearance/Hygiene: Unremarkable Behavior Characteristics: Cooperative Mood: Anxious   Aggressive Behavior  Targets: (not recorded)   Thought Process  Thought Process Coherency: Within Defined Limits Content: Within Defined Limits Delusions: None reported or observed Perception: Within Defined  Limits Hallucination: None reported or observed Judgment: Limited Confusion: None  Danger to Self/Others  Danger to Self Current suicidal ideation?: Denies Description of Suicide Plan: (not recorded) Self-Injurious Behavior: 1 Agreement Not to Harm Self: (not recorded) Description of Agreement: (not recorded) Danger to Others: (not recorded)

## 2024-03-28 NOTE — Group Note (Signed)
 Date:  03/28/2024 Time:  11:30 AM  Group Topic/Focus:  Building Self Esteem:   The Focus of this group is helping patients become aware of the effects of self-esteem on their lives, the things they and others do that enhance or undermine their self-esteem, seeing the relationship between their level of self-esteem and the choices they make and learning ways to enhance self-esteem. Managing Feelings:   The focus of this group is to identify what feelings patients have difficulty handling and develop a plan to handle them in a healthier way upon discharge.    Participation Level:  Active  Participation Quality:  Appropriate  Affect:  Appropriate  Cognitive:  Appropriate  Insight: Appropriate  Engagement in Group:  Improving  Modes of Intervention:  Discussion and Education  Additional Comments:  Pt attended group and participated in  Self-Encouragement Group Therapy  Cande Mastropietro E Nicholaos Schippers 03/28/2024, 11:30 AM

## 2024-03-28 NOTE — Progress Notes (Signed)
   03/28/24 1000  Psych Admission Type (Psych Patients Only)  Admission Status Voluntary  Psychosocial Assessment  Patient Complaints Anxiety;Worrying  Eye Contact Fair  Facial Expression Anxious  Affect Appropriate to circumstance  Speech Logical/coherent  Interaction Assertive  Motor Activity Other (Comment) (WNL)  Appearance/Hygiene Unremarkable  Behavior Characteristics Cooperative;Calm  Mood Anxious  Thought Process  Coherency WDL  Content WDL  Delusions None reported or observed  Perception WDL  Hallucination None reported or observed  Judgment Poor  Confusion WDL  Danger to Self  Current suicidal ideation? Denies  Self-Injurious Behavior No self-injurious ideation or behavior indicators observed or expressed   Agreement Not to Harm Self Yes  Description of Agreement verbal  Danger to Others  Danger to Others None reported or observed

## 2024-03-28 NOTE — Group Note (Signed)
 Date:  03/28/2024 Time:  8:48 PM  Group Topic/Focus:  Wrap-Up Group:   The focus of this group is to help patients review their daily goal of treatment and discuss progress on daily workbooks.    Participation Level:  Active  Participation Quality:  Appropriate  Affect:  Appropriate  Cognitive:  Appropriate  Insight: Appropriate  Engagement in Group:  Engaged  Modes of Intervention:  Socialization  Additional Comments:    Kristen VEAR Gibbon 03/28/2024, 8:48 PM

## 2024-03-28 NOTE — Progress Notes (Signed)
 [facility] MD Progress Note  03/28/2024 12:56 PM Tamakia Porto  MRN:  [id number]  Principal Problem: MDD (major depressive disorder), recurrent episode, severe (HCC) Diagnosis: Principal Problem:   MDD (major depressive disorder), recurrent episode, severe (HCC)  Reason for admission:  Donnell Segoviano is a 28 year old African-American female with prior psychiatric diagnoses significant for MDD recurrent severe, bipolar affective disorder, mixed severe, cannabis abuse, ADHD combined type, oppositional defiant disorder, PTSD, and insomnia.  Patient presents to [facility] from [facility] behavioral health urgent care for worsening depression resulting in suicidal ideation with specific plans to harm herself when her 3 children are at school in the context of multi-family member deaths within the past 5 years.  BAL: Less than 15, UDS: Negative for any substances.   24-hour chart review: Patient case discussed in interdisciplinary team meeting.  Vital signs reviewed without critical values.  PRNs of hydroxyzine  x 3 required for anxiety, Zofran  x 2 required for nausea and trazodone x 1 required for insomnia.  No agitation protocol required.  Today's assessment notes:  On assessment today, the pt reports that her mood is improving compared to admission, however, rates depression and anxiety as #10/10, with 10 being high severity.  Reports mood is less labile today.  Per 24-hour chart review, patient received as needed for anxiety x 3.  No report of GI discomfort during this assessment.  Patient presents alert, cooperative, calm, and oriented to person, time, place, and situation.  Thought process seems disorganized and confused. She asked several questions regarding her anxiety medication of gabapentin, which patient was made aware was increased from 300 mg p.o. q. nightly to 300 mg p.o. twice daily.  Requested has Zoloft to be changed from nightly to every morning.  Zoloft  frequency change as requested.  Reports not understanding the admission process and what it means to set a goal for symptoms improvement prior to discharge.  Requesting a meeting between patient, psychiatrist and this provider and also requested to know when she will be discharged from the hospital due to missing her children.  Met with patient in the assessment room and explained the process of admission, following her treatment plan, discharge and interdisciplinary team working with patient.  She appears to have some understanding of her treatment plan.  Active listening and emotional support provided to patient for ongoing stressors. Speech is clear coherent with normal volume and pattern.  Observed attending and participating in therapeutic milieu and unit group activities and being compliant with her medications.  She denies delusional thinking or paranoia.  Further denies SI, HI, or AVH. Sleep is improving Appetite is stable Concentration is better Energy level is adequate  Denies suicidal thoughts.  Further denies suicidal intent and plan.  Denies having any HI.  Denies having psychotic symptoms.   Denies having side effects to current psychiatric medications.   We discussed changes to current medication regimen, including changing the frequency of Zoloft from nightly to every morning.  Patient is in agreement with this adjustment  Discussed the following psychosocial stressors: Encouraged to continue to attend therapeutic milieu and unit group activities as this has proven to improve patient's mood.  Total Time spent with patient: 45 minutes  Past Psychiatric History: Previous Psych Diagnoses: MDD recurrent severe, bipolar disorder mixed, ADHD, ODD, PTSD, and insomnia Prior inpatient treatment: Yes, at [facility] child and adolescent unit in 2011 Current/prior outpatient treatment: Yes, currently receiving [facility] and therapy at [facility] Prior  rehab hx: Denies Psychotherapy hx: Yes History of suicide: Denies History of homicide or aggression: Denies Psychiatric medication history: Patient has been on trial lamotrigine, prazosin Psychiatric medication compliance history: Yes Neuromodulation history: Denies Current Psychiatrist: Yes, at [facility] Current therapist: Yes, [facility]  Past Medical History:  Past Medical History:  Diagnosis Date   ADHD (attention deficit hyperactivity disorder)    vyvanse , none in a couple of weeks due to running out   Anxiety    Bipolar 1 disorder (HCC)    Depression    Hypertension    takes Procardia 30mg    Varicella    remote history    Past Surgical History:  Procedure Laterality Date   ROOT CANAL     Family History:  Family History  Problem Relation Age of Onset   Depression Mother        Lorazepam, prestige   Anxiety disorder Mother    Bipolar disorder Mother        seroquel   Hypertension Father    Depression Father    Anxiety disorder Father    Heart disease Father    Hypertension Maternal Grandmother    Diabetes Maternal Grandmother    Hypertension Maternal Grandfather    Diabetes Maternal Grandfather    Schizophrenia Paternal Grandmother    Dementia Paternal Grandmother    Family Psychiatric  History: See H&P Social History:  Social History   Substance and Sexual Activity  Alcohol Use Not Currently   Comment: occ     Social History   Substance and Sexual Activity  Drug Use Not Currently   Types: Marijuana   Comment: occasional use.  2 blunts.    Social History   Socioeconomic History   Marital status: Single    Spouse name: Not on file   Number of children: Not on file   Years of education: Not on file   Highest education level: Not on file  Occupational History   Occupation: student    Comment: rising 11th grade.    Tobacco Use   Smoking status: Former    Current packs/day: 0.10    Average packs/day: 0.1 packs/day for 0.6  years (0.1 ttl pk-yrs)    Types: Cigarettes    Passive exposure: Never   Smokeless tobacco: Never   Tobacco comments:    Gets nauseous when she smokes too much.   Interested in Engineer, maintenance (IT) for anxiety, stress  Vaping Use   Vaping status: Never Used  Substance and Sexual Activity   Alcohol use: Not Currently    Comment: occ   Drug use: Not Currently    Types: Marijuana    Comment: occasional use.  2 blunts.   Sexual activity: Yes    Partners: Male    Birth control/protection: Condom, Patch  Other Topics Concern   Not on file  Social History Narrative   Wants to be a Warden/ranger.  Wants to go college.     Social Drivers of Corporate investment banker Strain: Not on File (09/23/2021)   Received from [employer]    Financial Resource Strain: 0  Food Insecurity: No Food Insecurity (03/27/2024)   Hunger Vital Sign    Worried About Running Out of Food in the Last Year: Never true    Ran Out of Food in the Last Year: Never true  Transportation Needs: Unmet Transportation Needs (03/27/2024)   PRAPARE - Administrator, Civil Service (Medical): Yes  Lack of Transportation (Non-Medical): Yes  Physical Activity: Not on File (09/23/2021)   Received from [facility]   Physical Activity    Physical Activity: 0  Stress: Not on File (09/23/2021)   Received from [facility]   Stress    Stress: 0  Social Connections: Not on File (02/13/2023)   Received from [employer] [facility]   Social Connections    Connectedness: 0   Additional Social History:    Sleep: Fair Estimated Sleeping Duration (Last 24 Hours): 3.75-4.25 hours  Appetite:  Good  Current Medications: Current Facility-Administered Medications  Medication Dose Route Frequency Provider Last Rate Last Admin   albuterol  (VENTOLIN  HFA) 108 (90 Base) MCG/ACT inhaler 1-2 puff  1-2 puff Inhalation Q6H PRN Malcolm Quast C, FNP       haloperidol (HALDOL) tablet 5 mg  5 mg Oral TID PRN Onuoha, Chinwendu V, NP        And   diphenhydrAMINE (BENADRYL) capsule 50 mg  50 mg Oral TID PRN Onuoha, Chinwendu V, NP       haloperidol lactate (HALDOL) injection 5 mg  5 mg Intramuscular TID PRN Onuoha, Chinwendu V, NP       And   diphenhydrAMINE (BENADRYL) injection 50 mg  50 mg Intramuscular TID PRN Onuoha, Chinwendu V, NP       And   LORazepam (ATIVAN) injection 2 mg  2 mg Intramuscular TID PRN Onuoha, Chinwendu V, NP       haloperidol lactate (HALDOL) injection 10 mg  10 mg Intramuscular TID PRN Onuoha, Chinwendu V, NP       And   diphenhydrAMINE (BENADRYL) injection 50 mg  50 mg Intramuscular TID PRN Onuoha, Chinwendu V, NP       And   LORazepam (ATIVAN) injection 2 mg  2 mg Intramuscular TID PRN Onuoha, Chinwendu V, NP       gabapentin (NEURONTIN) capsule 300 mg  300 mg Oral BID Adonis Yim C, FNP   300 mg at 03/27/24 1812   hydrOXYzine  (ATARAX ) tablet 25 mg  25 mg Oral TID PRN Onuoha, Chinwendu V, NP   25 mg at 03/28/24 1142   lamoTRIgine (LAMICTAL) tablet 200 mg  200 mg Oral Daily Ottilia Pippenger C, FNP   200 mg at 03/28/24 9187   mirtazapine (REMERON) tablet 15 mg  15 mg Oral QHS Emyah Roznowski C, FNP   15 mg at 03/27/24 2118   NIFEdipine (PROCARDIA-XL/NIFEDICAL-XL) 24 hr tablet 60 mg  60 mg Oral Daily Nour Scalise C, FNP   60 mg at 03/28/24 9187   ondansetron  (ZOFRAN -ODT) disintegrating tablet 4 mg  4 mg Oral Q8H PRN Daneka Lantigua C, FNP   4 mg at 03/28/24 1141   prazosin (MINIPRESS) capsule 5 mg  5 mg Oral QHS Afton Lavalle C, FNP   5 mg at 03/27/24 2118   sertraline (ZOLOFT) tablet 25 mg  25 mg Oral Daily Tynasia Mccaul C, FNP   25 mg at 03/28/24 1141   traZODone (DESYREL) tablet 50 mg  50 mg Oral QHS PRN Onuoha, Chinwendu V, NP   50 mg at 03/26/24 2330   Lab Results:  Results for orders placed or performed during the hospital encounter of 03/26/24 (from the past 48 hours)  CBC with Differential/Platelet     Status: None   Collection Time: 03/26/24  3:36 PM  Result Value Ref Range   WBC 5.0 4.0 - 10.5 K/uL   RBC 4.78  3.87 - 5.11 MIL/uL   Hemoglobin 13.5 12.0 - 15.0 g/dL  HCT 40.5 36.0 - 46.0 %   MCV 84.7 80.0 - 100.0 fL   MCH 28.2 26.0 - 34.0 pg   MCHC 33.3 30.0 - 36.0 g/dL   RDW 86.6 88.4 - 84.4 %   Platelets 246 150 - 400 K/uL   nRBC 0.0 0.0 - 0.2 %   Neutrophils Relative % 47 %   Neutro Abs 2.3 1.7 - 7.7 K/uL   Lymphocytes Relative 39 %   Lymphs Abs 1.9 0.7 - 4.0 K/uL   Monocytes Relative 9 %   Monocytes Absolute 0.4 0.1 - 1.0 K/uL   Eosinophils Relative 5 %   Eosinophils Absolute 0.3 0.0 - 0.5 K/uL   Basophils Relative 0 %   Basophils Absolute 0.0 0.0 - 0.1 K/uL   Immature Granulocytes 0 %   Abs Immature Granulocytes 0.01 0.00 - 0.07 K/uL    Comment: Performed at [facility], [address], [place], [place] [address]  Comprehensive metabolic panel     Status: Abnormal   Collection Time: 03/26/24  3:36 PM  Result Value Ref Range   Sodium 137 135 - 145 mmol/L   Potassium 4.1 3.5 - 5.1 mmol/L   Chloride 108 98 - 111 mmol/L   CO2 21 (L) 22 - 32 mmol/L   Glucose, Bld 82 70 - 99 mg/dL    Comment: Glucose reference range applies only to samples taken after fasting for at least 8 hours.   BUN 8 6 - 20 mg/dL   Creatinine, Ser 8.93 (H) 0.44 - 1.00 mg/dL   Calcium 9.0 8.9 - 89.6 mg/dL   Total Protein 7.2 6.5 - 8.1 g/dL   Albumin 3.7 3.5 - 5.0 g/dL   AST 19 15 - 41 U/L   ALT 29 0 - 44 U/L   Alkaline Phosphatase 70 38 - 126 U/L   Total Bilirubin 0.5 0.0 - 1.2 mg/dL   GFR, Estimated >39 >39 mL/min    Comment: (NOTE) Calculated using the CKD-EPI Creatinine Equation (2021)    Anion gap 8 5 - 15    Comment: Performed at [facility], [address], [place], [place] [address]  Hemoglobin A1c     Status: None   Collection Time: 03/26/24  3:36 PM  Result Value Ref Range   Hgb A1c MFr Bld 5.2 4.8 - 5.6 %    Comment: (NOTE) Diagnosis of Diabetes The following HbA1c ranges recommended by the American Diabetes Association (ADA) may be used as an aid in the diagnosis of diabetes  mellitus.  Hemoglobin             Suggested A1C NGSP%              Diagnosis  <5.7                   Non Diabetic  5.7-6.4                Pre-Diabetic  >6.4                   Diabetic  <7.0                   Glycemic control for                       adults with diabetes.     Mean Plasma Glucose 102.54 mg/dL    Comment: Performed at [facility], [address] [place], [place] [address]  Magnesium     Status: None   Collection Time: 03/26/24  3:36 PM  Result Value Ref Range   Magnesium 1.9 1.7 - 2.4 mg/dL    Comment: Performed at [facility], [address] [place], [place] [address]  Ethanol     Status: None   Collection Time: 03/26/24  3:36 PM  Result Value Ref Range   Alcohol, Ethyl (B) <15 <15 mg/dL    Comment: (NOTE) For medical purposes only. Performed at [facility], [address] [place], [place] [address]   Lipid panel     Status: Abnormal   Collection Time: 03/26/24  3:36 PM  Result Value Ref Range   Cholesterol 211 (H) 0 - 200 mg/dL   Triglycerides 67 <849 mg/dL   HDL 73 >59 mg/dL   Total CHOL/HDL Ratio 2.9 RATIO   VLDL 13 0 - 40 mg/dL   LDL Cholesterol 874 (H) 0 - 99 mg/dL    Comment:        Total Cholesterol/HDL:CHD Risk Coronary Heart Disease Risk Table                     Men   Women  1/2 Average Risk   3.4   3.3  Average Risk       5.0   4.4  2 X Average Risk   9.6   7.1  3 X Average Risk  23.4   11.0        Use the calculated Patient Ratio above and the CHD Risk Table to determine the patient's CHD Risk.        ATP III CLASSIFICATION (LDL):  <100     mg/dL   Optimal  899-870  mg/dL   Near or Above                    Optimal  130-159  mg/dL   Borderline  839-810  mg/dL   High  >809     mg/dL   Very High Performed at [facility], [address], [place], [place] [address]   TSH     Status: None   Collection Time: 03/26/24  3:36 PM  Result Value Ref Range   TSH 0.464 0.350 - 4.500 uIU/mL    Comment:  Performed by a 3rd Generation assay with a functional sensitivity of <=0.01 uIU/mL. Performed at [facility], [address] [place], [place] [address]   Urinalysis, Complete w Microscopic -Urine, Clean Catch     Status: Abnormal   Collection Time: 03/26/24  3:38 PM  Result Value Ref Range   Color, Urine AMBER (A) YELLOW    Comment: BIOCHEMICALS MAY BE AFFECTED BY COLOR   APPearance CLOUDY (A) CLEAR   Specific Gravity, Urine 1.027 1.005 - 1.030   pH 5.0 5.0 - 8.0   Glucose, UA NEGATIVE NEGATIVE mg/dL   Hgb urine dipstick NEGATIVE NEGATIVE   Bilirubin Urine NEGATIVE NEGATIVE   Ketones, ur NEGATIVE NEGATIVE mg/dL   Protein, ur 30 (A) NEGATIVE mg/dL   Nitrite NEGATIVE NEGATIVE   Leukocytes,Ua NEGATIVE NEGATIVE   RBC / HPF 0-5 0 - 5 RBC/hpf   WBC, UA 6-10 0 - 5 WBC/hpf   Bacteria, UA MANY (A) NONE SEEN   Squamous Epithelial / HPF 0-5 0 - 5 /HPF   Mucus PRESENT     Comment: Performed at [facility], [address], [place], [place] [address]  POCT Urine  Drug Screen - (I-Screen)     Status: None   Collection Time: 03/26/24  4:01 PM  Result Value Ref Range   POC Amphetamine UR None Detected NONE DETECTED (Cut Off Level 1000 ng/mL)   POC Secobarbital (BAR) None Detected NONE DETECTED (Cut Off Level 300 ng/mL)   POC Buprenorphine (BUP) None Detected NONE DETECTED (Cut Off Level 10 ng/mL)   POC Oxazepam (BZO) None Detected NONE DETECTED (Cut Off Level 300 ng/mL)   POC Cocaine UR None Detected NONE DETECTED (Cut Off Level 300 ng/mL)   POC Methamphetamine UR None Detected NONE DETECTED (Cut Off Level 1000 ng/mL)   POC Morphine None Detected NONE DETECTED (Cut Off Level 300 ng/mL)   POC Methadone UR None Detected NONE DETECTED (Cut Off Level 300 ng/mL)   POC Oxycodone UR None Detected NONE DETECTED (Cut Off Level 100 ng/mL)   POC Marijuana UR None Detected NONE DETECTED (Cut Off Level 50 ng/mL)  POC urine preg     Status: None   Collection Time: 03/26/24  4:01 PM  Result  Value Ref Range   Preg Test, Ur Negative Negative    Blood Alcohol level:  Lab Results  Component Value Date   [facility] <15 03/26/2024   ETH <11 12/08/2011    Metabolic Disorder Labs: Lab Results  Component Value Date   HGBA1C 5.2 03/26/2024   MPG 102.54 03/26/2024   MPG 105 12/12/2011   No results found for: PROLACTIN Lab Results  Component Value Date   CHOL 211 (H) 03/26/2024   TRIG 67 03/26/2024   HDL 73 03/26/2024   CHOLHDL 2.9 03/26/2024   VLDL 13 03/26/2024   LDLCALC 125 (H) 03/26/2024   LDLCALC 59 12/12/2011   Physical Findings: AIMS:  ,  ,  ,  ,  ,  ,   CIWA:    COWS:     Musculoskeletal: Strength & Muscle Tone: within normal limits Gait & Station: normal Patient leans: N/A  Psychiatric Specialty Exam:  Presentation  General Appearance:  Appropriate for Environment; Casual  Eye Contact: Fleeting  Speech: Clear and Coherent  Speech Volume: Normal  Handedness: Right  Mood and Affect  Mood: Anxious; Depressed; Irritable; Hopeless; Labile  Affect: Appropriate; Congruent  Thought Process  Thought Processes: Coherent  Descriptions of Associations:Circumstantial  Orientation:Full (Time, Place and Person)  Thought Content:WDL; Scattered  History of Schizophrenia/Schizoaffective disorder:No  Duration of Psychotic Symptoms:No data recorded Hallucinations:Hallucinations: None  Ideas of Reference:None  Suicidal Thoughts:Suicidal Thoughts: No SI Active Intent and/or Plan: -- (Denies) SI Passive Intent and/or Plan: -- (Denies)  Homicidal Thoughts:Homicidal Thoughts: No  Sensorium  Memory: Immediate Fair; Recent Fair  Judgment: Impaired  Insight: Shallow  Executive Functions  Concentration: Good  Attention Span: Good  Recall: Fair  Fund of Knowledge: Fair  Language: Fair  Psychomotor Activity  Psychomotor Activity: Psychomotor Activity: Normal  Assets  Assets: Physical Health; Resilience  Sleep   Sleep: Sleep: Fair Number of Hours of Sleep: 4.5  Physical Exam: Physical Exam Vitals and nursing note reviewed.  Constitutional:      General: She is not in acute distress.    Appearance: She is normal weight. She is not ill-appearing.  HENT:     Head: Normocephalic.     Right Ear: External ear normal.     Left Ear: External ear normal.     Nose: Nose normal.     Mouth/Throat:     Mouth: Mucous membranes are moist.     Pharynx: Oropharynx is clear.  Eyes:     Extraocular Movements: Extraocular movements intact.  Cardiovascular:     Rate and Rhythm: Normal rate.     Pulses: Normal pulses.  Pulmonary:     Effort: Pulmonary effort is normal. No respiratory distress.  Abdominal:     Comments: Deferred  Genitourinary:    Comments: Deferred Musculoskeletal:        General: Normal range of motion.     Cervical back: Normal range of motion.  Skin:    General: Skin is dry.  Neurological:     General: No focal deficit present.     Mental Status: She is alert and oriented to person, place, and time.  Psychiatric:     Comments: Judgment is shallow    Review of Systems  Constitutional:  Negative for chills and fever.  HENT:  Negative for sore throat.   Eyes:  Negative for blurred vision.  Respiratory:  Negative for cough, sputum production, shortness of breath and wheezing.   Cardiovascular:  Negative for chest pain and palpitations.  Gastrointestinal:  Positive for nausea. Negative for abdominal pain, diarrhea, heartburn and vomiting.  Genitourinary:  Negative for dysuria.  Musculoskeletal:  Negative for myalgias.  Skin:  Negative for itching.  Neurological:  Negative for dizziness, seizures and headaches.  Endo/Heme/Allergies:        See allergy listing  Psychiatric/Behavioral:  Positive for depression. Negative for hallucinations, substance abuse and suicidal ideas. The patient is nervous/anxious and has insomnia.    Blood pressure 119/73, pulse 96, temperature 98.2  F (36.8 C), temperature source Oral, resp. rate 18, SpO2 99%. There is no height or weight on file to calculate BMI.  Treatment Plan Summary: Daily contact with patient to assess and evaluate symptoms and progress in treatment and Medication management  Observation Level/Precautions:  15 minute checks  Laboratory:   CBC: Within normal limits Chemistry Profile: CO2 21 low, creatinine 1.06 high, otherwise normal Folic Acid: N/A GGT: N/A HbAIC: N/A HCG: Urine pregnancy test negative UDS: Negative for substances UA: Colitis cloudy, protein 30, bacteria many. Vitamin B-12: NA  Psychotherapy: Therapeutic milieu  Medications: See MAR  Consultations: Pending  Discharge Concerns: Safety  Estimated LOS: 3 to 7 days  Other:      Assessment: Elizah Lydon is a 28 year old African-American female with prior psychiatric diagnoses significant for MDD recurrent severe, bipolar affective disorder mixed severe, cannabis abuse, ADHD combined type, oppositional defiant disorder, PTSD, and insomnia.  Patient [facility] to [facility] from [facility] behavioral health urgent care for worsening depression resulting in suicidal ideation with specific plans to harm herself when her 3 children are at school in the context of multi-family deaths within the past 5 years.  BAL: Less than 15, UDS: Negative for any substances.  After medical evaluation, stabilization, and clearance, patient was transferred to [facility] for further psychiatric evaluation and treatment.   Physician Treatment Plan for Primary Diagnosis: MDD (major depressive disorder), recurrent episode, severe (HCC)   Plans: Medications: Resume home medications --Continue Lamictal 200 mg p.o. daily for mood stabilization --Discontinue Vyvanse  20 mg capsule p.o. daily for ADHD --Continue Remeron 15 mg p.o. daily for depression and sleep --Continue prazosin 5 mg capsule p.o. daily at bedtime for weird dreams -- Continue  gabapentin 300 mg tablet p.o. twice daily -- Continue trazodone tablet 50 mg p.o. q. nightly at bedtime for insomnia -- Continue hydroxyzine  tablet 25 mg p.o. 3 times daily as needed for anxiety --Increase Zoloft from 25 mg to 50  mg tablet p.o. daily for depression and anxiety starting 03/29/2024.    Continue BH Agitation Protocol    Medications for other medical conditions: --Continue ventilator HFA 108 90 base micrograms/ACT inhaler 1 to 2 puffs every 6 hours as needed for wheezing shortness of breath --Continue Procardia XL 24-hour tablet 60 mg p.o. daily for high blood pressure   Other PRN Medications  -Acetaminophen  650 mg every 6 as needed/mild pain  -Maalox 30 mL oral every 4 as needed/digestion  -Magnesium hydroxide 30 mL daily as needed/mild constipation  --Zofran  disintegrating tab 4 mg p.o. every 8 hours as needed for nausea and vomiting   --The risks/benefits/side-effects/alternatives to this medication were discussed in detail with the patient and time was given for questions. The patient consents to medication trial.   -- Metabolic profile and EKG monitoring obtained while on an atypical antipsychotic (BMI: Lipid Panel: HbgA1c: QTc:)   -- Encouraged patient to participate in unit milieu and in scheduled group therapies    Safety and Monitoring:  Voluntary admission to inpatient psychiatric unit for safety, stabilization and treatment  Daily contact with patient to assess and evaluate symptoms and progress in treatment  Patient's case to be discussed in multi-disciplinary team meeting  Observation Level : q15 minute checks  Vital signs: q12 hours  Precautions: suicide, but pt currently verbally contracts for safety on unit?    Discharge Planning:  Social work and case management to assist with discharge planning and identification of hospital follow-up needs prior to discharge  Estimated LOS: 5-7?days  Discharge Concerns: Need to establish Safety plan; Medication  compliance and effectiveness  Discharge Goals: Return home with outpatient referrals for mental health follow-up including medication management/psychotherapy.    Long Term Goal(s): Improvement in symptoms so as ready for discharge   Short Term Goals: Ability to identify changes in lifestyle to reduce recurrence of condition will improve, Ability to verbalize feelings will improve, Ability to disclose and discuss suicidal ideas, Ability to demonstrate self-control will improve, Ability to identify and develop effective coping behaviors will improve, Ability to maintain clinical measurements within normal limits will improve, Compliance with prescribed medications will improve, and Ability to identify triggers associated with substance abuse/mental health issues will improve   Physician Treatment Plan for Secondary Diagnosis: Principal Problem:   MDD (major depressive disorder), recurrent episode, severe (HCC)   I certify that inpatient services furnished can reasonably be expected to improve the patient's condition.  Ellouise JAYSON Azure, FNP 03/28/2024, 12:56 PM

## 2024-03-28 NOTE — Group Note (Signed)
 Therapy Group Note  Group Topic:Other  Group Date: 03/28/2024 Start Time: 1500 End Time: 1530 Facilitators: Wei Poplaski G, OT    The primary objective of this topic is to explore and understand the concept of occupational balance in the context of daily living. The term occupational balance is defined broadly, encompassing all activities that occupy an individual's time and energy, including self-care, leisure, and work-related tasks. The goal is to guide participants towards achieving a harmonious blend of these activities, tailored to their personal values and life circumstances. This balance is aimed at enhancing overall well-being, not by equally distributing time across activities, but by ensuring that daily engagements are fulfilling and not draining. The content delves into identifying various barriers that individuals face in achieving occupational balance, such as overcommitment, misaligned priorities, external pressures, and lack of effective time management. The impact of these barriers on occupational performance, roles, and lifestyles is examined, highlighting issues like reduced efficiency, strained relationships, and potential health problems. Strategies for cultivating occupational balance are a key focus. These strategies include practical methods like time blocking, prioritizing tasks, establishing self-care rituals, decluttering, connecting with nature, and engaging in reflective practices. These approaches are designed to be adaptable and applicable to a wide range of life scenarios, promoting a proactive and mindful approach to daily living. The overall aim is to equip participants with the knowledge and tools to create a balanced lifestyle that supports their mental, emotional, and physical health, thereby improving their functional performance in daily life.     Participation Level: Engaged   Participation Quality: Independent   Behavior: Appropriate    Speech/Thought Process: Relevant   Affect/Mood: Appropriate   Insight: Fair   Judgement: Fair      Modes of Intervention: Education  Patient Response to Interventions:  Attentive   Plan: Continue to engage patient in OT groups 2 - 3x/week.  03/28/2024  [place] KANDICE Purpura, OT   Dietrich Samuelson, OT

## 2024-03-28 NOTE — Plan of Care (Signed)
   Problem: Education: Goal: Emotional status will improve Outcome: Progressing Goal: Mental status will improve Outcome: Progressing

## 2024-03-28 NOTE — Group Note (Signed)
 Date:  03/28/2024 Time:  12:02 PM  Group Topic/Focus:  Nutrition Group    Participation Level:  Active  Participation Quality:  Appropriate  Affect:  Appropriate  Cognitive:  Appropriate  Insight: Appropriate  Engagement in Group:  Improving  Modes of Intervention:  Discussion and Education  Additional Comments:  Pt attended Nutrition Group  Raequon Catanzaro E Nitara Szczerba 03/28/2024, 12:02 PM

## 2024-03-28 NOTE — Group Note (Signed)
 Recreation Therapy Group Note   Group Topic:Other  Group Date: 03/28/2024 Start Time: 1300 End Time: 1400 Facilitators: Konnie Noffsinger-McCall, LRT,CTRS Location: [address] Dayroom   Activity Description/Intervention: Therapeutic Drumming. Patients with peers and staff were given the opportunity to engage in a leader facilitated [facility] Group Empowerment Drumming Circle with staff from the [employer], in partnership with [employer]. Teaching laboratory technician and trained [employer], Norleen Mon leading with LRT observing and documenting intervention and pt response. This evidenced-based practice targets 7 areas of health and wellbeing in the human experience including: stress-reduction, exercise, self-expression, camaraderie/support, nurturing, spirituality, and music-making (leisure).   Goal Area(s) Addresses:  Patient will engage in pro-social way in music group.  Patient will follow directions of drum leader on the first prompt. Patient will demonstrate no behavioral issues during group.  Patient will identify if a reduction in stress level occurs as a result of participation in therapeutic drum circle.    Education: Leisure exposure, Pharmacologist, Musical expression, Discharge Planning   Affect/Mood: Appropriate   Participation Level: Engaged   Participation Quality: Independent   Behavior: Appropriate   Speech/Thought Process: Focused   Insight: Good   Judgement: Good   Modes of Intervention: Teaching laboratory technician   Patient Response to Interventions:  Engaged   Education Outcome:  In group clarification offered    Clinical Observations/Individualized Feedback:     Plan: Continue to engage patient in RT group sessions 2-3x/week.   Tzivia Oneil-McCall, LRT,CTRS 03/28/2024 2:37 PM

## 2024-03-29 NOTE — Progress Notes (Signed)
   03/29/24 2121  Psych Admission Type (Psych Patients Only)  Admission Status Voluntary  Psychosocial Assessment  Patient Complaints Anxiety  Eye Contact Fair  Facial Expression Animated  Affect Appropriate to circumstance  Speech Logical/coherent  Interaction Assertive  Motor Activity Other (Comment) (WDL)  Appearance/Hygiene Unremarkable  Behavior Characteristics Cooperative  Mood Anxious  Thought Process  Coherency WDL  Content WDL  Delusions None reported or observed  Perception WDL  Hallucination None reported or observed  Judgment Limited  Confusion None  Danger to Self  Current suicidal ideation? Denies  Danger to Others  Danger to Others None reported or observed

## 2024-03-29 NOTE — Progress Notes (Signed)
 [facility] MD Progress Note  03/29/2024 10:14 AM Elizabeth Cameron  MRN:  [id number]  Principal Problem: MDD (major depressive disorder), recurrent episode, severe (HCC) Diagnosis: Principal Problem:   MDD (major depressive disorder), recurrent episode, severe (HCC)  Reason for admission:  Elizabeth Cameron is a 28 year old African-American female with prior psychiatric diagnoses significant for MDD recurrent severe, bipolar affective disorder, mixed severe, cannabis abuse, ADHD combined type, oppositional defiant disorder, PTSD, and insomnia.  Patient presents to [facility] from [facility] behavioral health urgent care for worsening depression resulting in suicidal ideation with specific plans to harm herself when her 3 children are at school in the context of multi-family member deaths within the past 5 years.  BAL: Less than 15, UDS: Negative for any substances.   24-hour chart review: Patient case discussed in interdisciplinary team meeting.  Vital signs reviewed without critical values.  PRNs of hydroxyzine  x 1 required for anxiety, and trazodone x 1 required for insomnia.  No agitation protocol required.  Today's assessment notes:  Patient seen and examined in the assessment room and presents alert, pleasant, cooperative, and oriented to person, time, place, and situation.  Mood is brighter today with smiles.  Chart reviewed and findings shared with the treatment team and consult with attending psychiatrist with recommendation to continue current treatment plan as already in progress.  Reports attending therapeutic milieu and unit group activities and learning some coping skills.  Cited rational mind to think things through before reacting and effective communication as one of the coping skills learned.  Patient added, these are important for me because, I plan to use what I learned with my children and teach them how to cope with their daily life stressors.  No report of  GI discomfort during this assessment. Speech is clear coherent with normal volume and pattern.  Observed attending and participating in therapeutic milieu and unit group activities and being compliant with her medications. She denies delusional thinking or paranoia.  Further denies SI, HI, or AVH.  Sleep is improving. Appetite is stable. Concentration is better. Energy level is adequate . Denies suicidal thoughts.  Further denies suicidal intent and plan. Denies having any HI. Denies having psychotic symptoms.   Denies having side effects to current psychiatric medications.   We discussed compliance to current medication regimen.   Discussed the following psychosocial stressors: Encouraged to continue to attend therapeutic milieu and unit group activities as this has proven to improve patient's mood.  Total Time spent with patient: 45 minutes  Past Psychiatric History: Previous Psych Diagnoses: MDD recurrent severe, bipolar disorder mixed, ADHD, ODD, PTSD, and insomnia Prior inpatient treatment: Yes, at [facility] in 2011 Current/prior outpatient treatment: Yes, currently [facility] and [facility] at [facility] Prior rehab hx: Denies Psychotherapy hx: Yes History of suicide: Denies History of homicide or aggression: Denies Psychiatric medication history: Patient has been on trial lamotrigine, prazosin Psychiatric medication compliance history: Yes Neuromodulation history: Denies Current Psychiatrist: Yes, at [facility] Current therapist: Yes, [facility]  Past Medical History:  Past Medical History:  Diagnosis Date   ADHD (attention deficit hyperactivity disorder)    vyvanse , none in a couple of weeks due to running out   Anxiety    Bipolar 1 disorder (HCC)    Depression    Hypertension    takes Procardia 30mg    Varicella    remote history    Past Surgical History:  Procedure Laterality Date  ROOT CANAL     Family History:  Family History  Problem Relation Age of Onset   Depression Mother        Lorazepam, prestige   Anxiety disorder Mother    Bipolar disorder Mother        seroquel   Hypertension Father    Depression Father    Anxiety disorder Father    Heart disease Father    Hypertension Maternal Grandmother    Diabetes Maternal Grandmother    Hypertension Maternal Grandfather    Diabetes Maternal Grandfather    Schizophrenia Paternal Grandmother    Dementia Paternal Grandmother    Family Psychiatric  History: See H&P Social History:  Social History   Substance and Sexual Activity  Alcohol Use Not Currently   Comment: occ     Social History   Substance and Sexual Activity  Drug Use Not Currently   Types: Marijuana   Comment: occasional use.  2 blunts.    Social History   Socioeconomic History   Marital status: Single    Spouse name: Not on file   Number of children: Not on file   Years of education: Not on file   Highest education level: Not on file  Occupational History   Occupation: student    Comment: rising 11th grade.    Tobacco Use   Smoking status: Former    Current packs/day: 0.10    Average packs/day: 0.1 packs/day for 0.6 years (0.1 ttl pk-yrs)    Types: Cigarettes    Passive exposure: Never   Smokeless tobacco: Never   Tobacco comments:    Gets nauseous when she smokes too much.   Interested in Engineer, maintenance (IT) for anxiety, stress  Vaping Use   Vaping status: Never Used  Substance and Sexual Activity   Alcohol use: Not Currently    Comment: occ   Drug use: Not Currently    Types: Marijuana    Comment: occasional use.  2 blunts.   Sexual activity: Yes    Partners: Male    Birth control/protection: Condom, Patch  Other Topics Concern   Not on file  Social History Narrative   Wants to be a Warden/ranger.  Wants to go college.     Social Drivers of Corporate investment banker Strain: Not on File (09/23/2021)    Received from [employer]    Financial Resource Strain: 0  Food Insecurity: No Food Insecurity (03/27/2024)   Hunger Vital Sign    Worried About Running Out of Food in the Last Year: Never true    Ran Out of Food in the Last Year: Never true  Transportation Needs: Unmet Transportation Needs (03/27/2024)   PRAPARE - Administrator, Civil Service (Medical): Yes    Lack of Transportation (Non-Medical): Yes  Physical Activity: Not on File (09/23/2021)   Received from [facility]   Physical Activity    Physical Activity: 0  Stress: Not on File (09/23/2021)   Received from [facility]   Stress    Stress: 0  Social Connections: Not on File (02/13/2023)   Received from [employer]   Social Connections    Connectedness: 0   Additional Social History:    Sleep: Fair Estimated Sleeping Duration (Last 24 Hours): 6.50-7.50 hours  Appetite:  Good  Current Medications: Current Facility-Administered Medications  Medication Dose Route Frequency Provider Last Rate Last Admin   albuterol  (VENTOLIN  HFA) 108 (90 Base) MCG/ACT inhaler 1-2 puff  1-2 puff Inhalation Q6H  PRN Jasper Ruminski C, FNP       haloperidol (HALDOL) tablet 5 mg  5 mg Oral TID PRN Onuoha, Chinwendu V, NP       And   diphenhydrAMINE (BENADRYL) capsule 50 mg  50 mg Oral TID PRN Onuoha, Chinwendu V, NP       haloperidol lactate (HALDOL) injection 5 mg  5 mg Intramuscular TID PRN Onuoha, Chinwendu V, NP       And   diphenhydrAMINE (BENADRYL) injection 50 mg  50 mg Intramuscular TID PRN Onuoha, Chinwendu V, NP       And   LORazepam (ATIVAN) injection 2 mg  2 mg Intramuscular TID PRN Onuoha, Chinwendu V, NP       haloperidol lactate (HALDOL) injection 10 mg  10 mg Intramuscular TID PRN Onuoha, Chinwendu V, NP       And   diphenhydrAMINE (BENADRYL) injection 50 mg  50 mg Intramuscular TID PRN Onuoha, Chinwendu V, NP       And   LORazepam (ATIVAN) injection 2 mg  2 mg Intramuscular TID PRN Onuoha, Chinwendu V, NP        gabapentin (NEURONTIN) capsule 300 mg  300 mg Oral BID Alinah Sheard C, FNP   300 mg at 03/29/24 0840   hydrOXYzine  (ATARAX ) tablet 25 mg  25 mg Oral TID PRN Onuoha, Chinwendu V, NP   25 mg at 03/28/24 2105   lamoTRIgine (LAMICTAL) tablet 200 mg  200 mg Oral Daily Layanna Charo C, FNP   200 mg at 03/29/24 0840   mirtazapine (REMERON) tablet 15 mg  15 mg Oral QHS Ricketta Colantonio C, FNP   15 mg at 03/28/24 2103   NIFEdipine (PROCARDIA-XL/NIFEDICAL-XL) 24 hr tablet 60 mg  60 mg Oral Daily Daivik Overley C, FNP   60 mg at 03/29/24 0840   ondansetron  (ZOFRAN -ODT) disintegrating tablet 4 mg  4 mg Oral Q8H PRN Phebe Dettmer C, FNP   4 mg at 03/28/24 1141   prazosin (MINIPRESS) capsule 5 mg  5 mg Oral QHS Greogory Cornette C, FNP   5 mg at 03/28/24 2103   sertraline (ZOLOFT) tablet 50 mg  50 mg Oral Daily Rio Taber C, FNP   50 mg at 03/29/24 0840   traZODone (DESYREL) tablet 50 mg  50 mg Oral QHS PRN Onuoha, Chinwendu V, NP   50 mg at 03/28/24 2105   Lab Results:  No results found for this or any previous visit (from the past 48 hours).   Blood Alcohol level:  Lab Results  Component Value Date   [facility] <15 03/26/2024   ETH <11 12/08/2011    Metabolic Disorder Labs: Lab Results  Component Value Date   HGBA1C 5.2 03/26/2024   MPG 102.54 03/26/2024   MPG 105 12/12/2011   No results found for: PROLACTIN Lab Results  Component Value Date   CHOL 211 (H) 03/26/2024   TRIG 67 03/26/2024   HDL 73 03/26/2024   CHOLHDL 2.9 03/26/2024   VLDL 13 03/26/2024   LDLCALC 125 (H) 03/26/2024   LDLCALC 59 12/12/2011   Physical Findings: AIMS:  ,  ,  ,  ,  ,  ,   CIWA:    COWS:     Musculoskeletal: Strength & Muscle Tone: within normal limits Gait & Station: normal Patient leans: N/A  Psychiatric Specialty Exam:  Presentation  General Appearance:  Casual  Eye Contact: Fair  Speech: Clear and Coherent  Speech Volume: Normal  Handedness: Right  Mood and Affect  Mood: Anxious;  Depressed  Affect: Congruent  Thought Process  Thought Processes: Coherent  Descriptions of Associations:Intact  Orientation:Full (Time, Place and Person)  Thought Content:WDL  History of Schizophrenia/Schizoaffective disorder:No  Duration of Psychotic Symptoms:No data recorded Hallucinations:Hallucinations: None  Ideas of Reference:None  Suicidal Thoughts:Suicidal Thoughts: No SI Active Intent and/or Plan: -- (Denies) SI Passive Intent and/or Plan: -- (Denies)  Homicidal Thoughts:Homicidal Thoughts: No  Sensorium  Memory: Immediate Fair; Recent Fair  Judgment: Impaired  Insight: Shallow  Executive Functions  Concentration: Good  Attention Span: Good  Recall: Fair  Fund of Knowledge: Fair  Language: Fair  Psychomotor Activity  Psychomotor Activity: Psychomotor Activity: Normal  Assets  Assets: Physical Health; Resilience  Sleep  Sleep: Sleep: Good Number of Hours of Sleep: 7.5  Physical Exam: Physical Exam Vitals and nursing note reviewed.  Constitutional:      General: She is not in acute distress.    Appearance: She is normal weight. She is not ill-appearing.  HENT:     Head: Normocephalic.     Right Ear: External ear normal.     Left Ear: External ear normal.     Nose: Nose normal.     Mouth/Throat:     Mouth: Mucous membranes are moist.     Pharynx: Oropharynx is clear.  Eyes:     Extraocular Movements: Extraocular movements intact.  Cardiovascular:     Rate and Rhythm: Normal rate.     Pulses: Normal pulses.  Pulmonary:     Effort: Pulmonary effort is normal. No respiratory distress.  Abdominal:     Comments: Deferred  Genitourinary:    Comments: Deferred Musculoskeletal:        General: Normal range of motion.     Cervical back: Normal range of motion.  Skin:    General: Skin is dry.  Neurological:     General: No focal deficit present.     Mental Status: She is alert and oriented to person, place, and time.   Psychiatric:     Comments: Judgment is shallow    Review of Systems  Constitutional:  Negative for chills and fever.  HENT:  Negative for sore throat.   Eyes:  Negative for blurred vision.  Respiratory:  Negative for cough, sputum production, shortness of breath and wheezing.   Cardiovascular:  Negative for chest pain and palpitations.  Gastrointestinal:  Positive for nausea. Negative for abdominal pain, diarrhea, heartburn and vomiting.  Genitourinary:  Negative for dysuria.  Musculoskeletal:  Negative for myalgias.  Skin:  Negative for itching.  Neurological:  Negative for dizziness, seizures and headaches.  Endo/Heme/Allergies:        See allergy listing  Psychiatric/Behavioral:  Positive for depression. Negative for hallucinations, substance abuse and suicidal ideas. The patient is nervous/anxious and has insomnia.    Blood pressure 123/84, pulse 85, temperature 98.8 F (37.1 C), temperature source Oral, resp. rate 18, SpO2 100%. There is no height or weight on file to calculate BMI.  Treatment Plan Summary: Daily contact with patient to assess and evaluate symptoms and progress in treatment and Medication management  Observation Level/Precautions:  15 minute checks  Laboratory:   CBC: Within normal limits Chemistry Profile: CO2 21 low, creatinine 1.06 high, otherwise normal Folic Acid: N/A GGT: N/A HbAIC: N/A HCG: Urine pregnancy test negative UDS: Negative for substances UA: Colitis cloudy, protein 30, bacteria many. Vitamin B-12: NA  Psychotherapy: Therapeutic milieu  Medications: See MAR  Consultations: Pending  Discharge Concerns: Safety  Estimated LOS: 3 to  7 days  Other:      Assessment: Elizabeth Cameron is a 28 year old African-American female with prior psychiatric diagnoses significant for MDD recurrent severe, bipolar affective disorder mixed severe, cannabis abuse, ADHD combined type, oppositional defiant disorder, PTSD, and insomnia.  Patient presents to  [facility] from [facility] behavioral health urgent care for worsening depression resulting in suicidal ideation with specific plans to harm herself when her 3 children are at school in the context of multi-family deaths within the past 5 years.  BAL: Less than 15, UDS: Negative for any substances.  After medical evaluation, stabilization, and clearance, patient was transferred to [facility] for further psychiatric evaluation and treatment.   Physician Treatment Plan for Primary Diagnosis: MDD (major depressive disorder), recurrent episode, severe (HCC)   Plans: Medications: Resume home medications --Continue Lamictal 200 mg p.o. daily for mood stabilization --Discontinue Vyvanse  20 mg capsule p.o. daily for ADHD --Continue Remeron 15 mg p.o. daily for depression and sleep --Continue prazosin 5 mg capsule p.o. daily at bedtime for weird dreams -- Continue gabapentin 300 mg tablet p.o. twice daily -- Continue trazodone tablet 50 mg p.o. q. nightly at bedtime for insomnia -- Continue hydroxyzine  tablet 25 mg p.o. 3 times daily as needed for anxiety --Continue Zoloft  50 mg tablet p.o. daily for depression and anxiety starting 03/29/2024.    Continue BH Agitation Protocol    Medications for other medical conditions: --Continue ventilator HFA 108 90 base micrograms/ACT inhaler 1 to 2 puffs every 6 hours as needed for wheezing shortness of breath --Continue Procardia XL 24-hour tablet 60 mg p.o. daily for high blood pressure   Other PRN Medications  -Acetaminophen  650 mg every 6 as needed/mild pain  -Maalox 30 mL oral every 4 as needed/digestion  -Magnesium hydroxide 30 mL daily as needed/mild constipation  --Zofran  disintegrating tab 4 mg p.o. every 8 hours as needed for nausea and vomiting   --The risks/benefits/side-effects/alternatives to this medication were discussed in detail with the patient and time was given for questions. The patient consents to medication  trial.   -- Metabolic profile and EKG monitoring obtained while on an atypical antipsychotic (BMI: Lipid Panel: HbgA1c: QTc:)   -- Encouraged patient to participate in unit milieu and in scheduled group therapies    Safety and Monitoring:  Voluntary admission to inpatient psychiatric unit for safety, stabilization and treatment  Daily contact with patient to assess and evaluate symptoms and progress in treatment  Patient's case to be discussed in multi-disciplinary team meeting  Observation Level : q15 minute checks  Vital signs: q12 hours  Precautions: suicide, but pt currently verbally contracts for safety on unit?    Discharge Planning:  Social work and case management to assist with discharge planning and identification of hospital follow-up needs prior to discharge  Estimated LOS: 5-7?days  Discharge Concerns: Need to establish Safety plan; Medication compliance and effectiveness  Discharge Goals: Return home with outpatient referrals for mental health follow-up including medication management/psychotherapy.    Long Term Goal(s): Improvement in symptoms so as ready for discharge   Short Term Goals: Ability to identify changes in lifestyle to reduce recurrence of condition will improve, Ability to verbalize feelings will improve, Ability to disclose and discuss suicidal ideas, Ability to demonstrate self-control will improve, Ability to identify and develop effective coping behaviors will improve, Ability to maintain clinical measurements within normal limits will improve, Compliance with prescribed medications will improve, and Ability to identify triggers associated with substance abuse/mental health issues  will improve   Physician Treatment Plan for Secondary Diagnosis: Principal Problem:   MDD (major depressive disorder), recurrent episode, severe (HCC)   I certify that inpatient services furnished can reasonably be expected to improve the patient's condition.  Elizabeth JAYSON Azure,  FNP 03/29/2024, 10:14 AM Patient ID: Elizabeth Cameron, female   DOB: Sep 20, 1995, 28 y.o.   MRN: [id number]

## 2024-03-29 NOTE — Progress Notes (Addendum)
 (  Sleep Hours) - 7.25  (Any PRNs that were needed, meds refused, or side effects to meds)- Trazodone PRN  (Any disturbances and when (visitation, over night)-none  (Concerns raised by the patient)- Request Medication for sleep; Patient reports still felt restless last night with sleep; inquired about raising sleep medication.  (SI/HI/AVH)- Denies

## 2024-03-29 NOTE — Group Note (Signed)
 Recreation Therapy Group Note   Group Topic:Communication  Group Date: 03/29/2024 Start Time: 0935 End Time: 1015 Facilitators: Benisha Hadaway-McCall, LRT,CTRS Location: [address] Dayroom   Goal Area(s) Addresses:  Patient will effectively listen to complete activity.  Patient will identify communication skills used to make activity successful.  Patient will identify how skills used during activity can be used to reach post d/c goals.    Behavioral Response: Engaged  Intervention: Building surveyor Activity - Geometric pattern cards, pencils, blank paper    Activity: Geometric Drawings.  Three volunteers from the peer group will be shown an abstract picture with a particular arrangement of geometrical shapes.  Each round, one 'speaker' will describe the pattern, as accurately as possible without revealing the image to the group.  The remaining group members will listen and draw the picture to reflect how it is described to them. Patients with the role of 'listener' cannot ask clarifying questions but, may request that the speaker repeat a direction. Once the drawings are complete, the presenter will show the rest of the group the picture and compare how close each person came to drawing the picture. LRT will facilitate a post-activity discussion regarding effective communication and the importance of planning, listening, and asking for clarification in daily interactions with others.  Education: Environmental consultant, Active listening, Support systems, Discharge planning  Education Outcome: Acknowledges understanding/In group clarification offered/Needs additional education.    Affect/Mood: Appropriate   Participation Level: Engaged   Participation Quality: Independent   Behavior: Appropriate   Speech/Thought Process: Focused   Insight: Good   Judgement: Good   Modes of Intervention: Cooperative Play   Patient Response to Interventions:  Engaged   Education Outcome:   In group clarification offered    Clinical Observations/Individualized Feedback: Pt was focused and attentive during group. At times pt would seem unsure about her drawings. Pt did better with the drawings than she gave herself credit for.    Plan: Continue to engage patient in RT group sessions 2-3x/week.   Evangelia Whitaker-McCall, LRT,CTRS 03/29/2024 11:32 AM

## 2024-03-29 NOTE — Progress Notes (Signed)
(  Sleep Hours) -7.5  (Any PRNs that were needed, meds refused, or side effects to meds)- Vistaril  25 mg , Trazodone 50 mg  (Any disturbances and when (visitation, over night)- none  (Concerns raised by the patient)- none  (SI/HI/AVH)- denies

## 2024-03-29 NOTE — Group Note (Signed)
 Date:  03/29/2024 Time:  9:56 AM  Group Topic/Focus:  Goals Group:   The focus of this group is to help patients establish daily goals to achieve during treatment and discuss how the patient can incorporate goal setting into their daily lives to aide in recovery.    Participation Level:  Active  Participation Quality:  Appropriate and Attentive  Affect:  Appropriate  Cognitive:  Alert and Appropriate  Insight: Appropriate and Improving  Engagement in Group:  Engaged and Improving  Modes of Intervention:  Discussion, Exploration, Socialization, and Support  Additional Comments:  Patient attended and participated in goals group. She shares her goals are to be positive to keep a healthy influence for her children.  Kristi HERO Meganne Rita 03/29/2024, 9:56 AM

## 2024-03-29 NOTE — [facility] Group Notes (Signed)
 Adult Psychoeducational Group Note  Date:  03/29/2024 Time:  6:08 PM  Group Topic/Focus:  Overcoming Stress:   The focus of this group is to define stress and help patients assess their triggers.  Participation Level:  Active  Participation Quality:  Appropriate and Supportive  Affect:  Appropriate and Tearful  Cognitive:  Alert and Appropriate  Insight: Appropriate and Good  Engagement in Group:  Engaged  Modes of Intervention:  Discussion  Additional Comments:  na  Joaquin MALVA Doing 03/29/2024, 6:08 PM

## 2024-03-29 NOTE — Progress Notes (Signed)
 D: Patient is alert, oriented, pleasant, and cooperative. Denies SI, HI, AVH, and verbally contracts for safety. Patient reports she slept fair last night with sleeping medication. Patient reports her appetite as fair. Patient rates her depression 0/10. Patient denies physical symptoms/pain.    A: Scheduled medications administered per MD order. Support provided. Patient educated on safety on the unit and medications. Routine safety checks every 15 minutes. Patient stated understanding to tell nurse about any new physical symptoms. Patient understands to tell staff of any needs.     R: No adverse drug reactions noted. Patient remains safe at this time and will continue to monitor.    03/29/24 1000  Psych Admission Type (Psych Patients Only)  Admission Status Voluntary  Psychosocial Assessment  Patient Complaints Anxiety  Eye Contact Fair  Facial Expression Animated  Affect Appropriate to circumstance  Speech Logical/coherent  Interaction Assertive  Motor Activity Other (Comment) (WNL)  Appearance/Hygiene Unremarkable  Behavior Characteristics Cooperative  Mood Preoccupied  Thought Process  Coherency WDL  Content WDL  Delusions None reported or observed  Perception WDL  Hallucination None reported or observed  Judgment Limited  Confusion None  Danger to Self  Current suicidal ideation? Denies  Self-Injurious Behavior No self-injurious ideation or behavior indicators observed or expressed   Agreement Not to Harm Self Yes  Description of Agreement verbal  Danger to Others  Danger to Others None reported or observed

## 2024-03-29 NOTE — [facility] Group Notes (Signed)
 Adult Psychoeducational Group Note  Date:  03/29/2024 Time:  8:24 PM  Group Topic/Focus:  Wrap-Up Group:   The focus of this group is to help patients review their daily goal of treatment and discuss progress on daily workbooks.  Participation Level:  Active  Participation Quality:  Appropriate  Affect:  Appropriate  Cognitive:  Alert and Appropriate  Insight: Appropriate  Engagement in Group:  Engaged  Modes of Intervention:  Discussion, Socialization, and Support  Additional Comments:  Pt attended and engaged in AA/Wrap up group.  Ac Colan MALVA Pepper 03/29/2024, 8:24 PM

## 2024-03-29 NOTE — Plan of Care (Signed)

## 2024-03-29 NOTE — Plan of Care (Signed)
   Problem: Education: Goal: Emotional status will improve Outcome: Progressing Goal: Mental status will improve Outcome: Progressing   Problem: Activity: Goal: Interest or engagement in activities will improve Outcome: Progressing

## 2024-03-29 NOTE — Group Note (Signed)
 Date:  03/29/2024 Time:  5:28 PM  Group Topic/Focus:  Exploring Control Through Creative Expression  The focus of this group is to encourage insight into personal experiences of control and flexibility, promote creative expression, and enhance group cohesion through shared activity and reflection.  Patients participated in a structured art-based group activity focused on the theme of control. Seated in a circle in the dayroom, each patient received a blank sheet of paper and was instructed to draw anything that came to mind without including their name. Every two minutes, papers were passed to the right, allowing each participant to add to or alter another person's drawing.  At the conclusion of the exercise, patients reflected on their emotional experiences during the process--particularly feelings associated with gaining, losing, and sharing control. The group discussed how control can shift due to external circumstances or the actions of others and explored ways to view these changes in a positive and adaptive manner. Patients shared their final collaborative drawings with the group.   Participation Level:  Active  Participation Quality:  Appropriate and Attentive  Affect:  Appropriate  Cognitive:  Alert  Insight: Appropriate and Good  Engagement in Group:  Engaged and Improving  Modes of Intervention:  Activity, Exploration, and Socialization  Additional Comments: Tearah attended and actively participated in this wellness group. She remained engaged and courteous and respectful toward her peers.  Kristi HERO Rachana Malesky 03/29/2024, 5:28 PM

## 2024-03-30 MED ORDER — MIRTAZAPINE 30 MG PO TABS
30.0000 mg | ORAL_TABLET | Freq: Every day | ORAL | Status: DC
  Administered 2024-03-30 – 2024-03-31 (×2): 30 mg via ORAL
  Filled 2024-03-30 (×2): qty 1

## 2024-03-30 MED ORDER — CLOTRIMAZOLE 1 % VA CREA
1.0000 | TOPICAL_CREAM | Freq: Every day | VAGINAL | Status: DC
Start: 2024-03-30 — End: 2024-04-06
  Administered 2024-03-30 – 2024-03-31 (×2): 1 via VAGINAL
  Filled 2024-03-30: qty 45

## 2024-03-30 NOTE — Plan of Care (Signed)
  Problem: Education: Goal: Emotional status will improve Outcome: Progressing Goal: Verbalization of understanding the information provided will improve Outcome: Progressing   Problem: Activity: Goal: Interest or engagement in activities will improve Outcome: Progressing   

## 2024-03-30 NOTE — [facility] Group Notes (Signed)
 [facility] Group Notes:  (Nursing/MHT/Case Management/Adjunct)  Date:  03/30/2024  Time:  9:21 PM  Type of Therapy:  Wrap-up group  Participation Level:  Active  Participation Quality:  Appropriate  Affect:  Appropriate  Cognitive:  Appropriate  Insight:  Appropriate  Engagement in Group:  Engaged  Modes of Intervention:  Education  Summary of Progress/Problems: Goal to keep positive. Rated day 10/10.  Elizabeth Cameron [place] 03/30/2024, 9:21 PM

## 2024-03-30 NOTE — Group Note (Deleted)
 Date:  03/29/2024 Time:  8:00 PM  Group Topic/Focus:  Wrap-Up Group:   The focus of this group is to help patients review their daily goal of treatment and discuss progress on daily workbooks.  Did Not Attend AA group or Wrap up Group.   Elizabeth Cameron 03/30/2024, 7:53 PM

## 2024-03-30 NOTE — Group Note (Signed)
 Date:  03/30/2024 Time:  10:46 AM  Group Topic/Focus:   Social Wellness: To foster a sense of well-being, connection, and personal growth within a supportive community. Such groups focus on improving various aspects of mental, emotional, and social health while encouraging positive interactions and self-awareness. Anger Iceberg: To help participants gain insight into the complex nature of anger and explore the deeper, often hidden emotions that lie beneath it. To understand how emotions can be presented as anger towards others.   Participation Level:  Active  Participation Quality:  Appropriate, Sharing, and Supportive  Affect:  Appropriate  Cognitive:  Appropriate  Insight: Appropriate  Engagement in Group:  Engaged and Supportive  Modes of Intervention:  Activity and Discussion  Additional Comments:    Isatou Agredano R Javar Eshbach 03/30/2024, 10:46 AM

## 2024-03-30 NOTE — Progress Notes (Signed)
   03/30/24 1000  Psych Admission Type (Psych Patients Only)  Admission Status Voluntary  Psychosocial Assessment  Patient Complaints Anxiety  Eye Contact Fair  Facial Expression Animated  Affect Appropriate to circumstance  Speech Logical/coherent  Interaction Assertive  Motor Activity Slow  Appearance/Hygiene Unremarkable  Behavior Characteristics Cooperative  Mood Anxious  Thought Process  Coherency WDL  Content WDL  Delusions None reported or observed  Perception WDL  Hallucination None reported or observed  Judgment Limited  Confusion None  Danger to Self  Current suicidal ideation? Denies  Description of Suicide Plan No Plan  Self-Injurious Behavior No self-injurious ideation or behavior indicators observed or expressed   Agreement Not to Harm Self Yes  Description of Agreement Verbal  Danger to Others  Danger to Others None reported or observed

## 2024-03-30 NOTE — Group Note (Signed)
 LCSW Group Therapy Note   Group Date: 03/30/2024 Start Time: 1020 End Time: 1145   Type of Therapy and Topic:  Group Therapy: Boundaries  Participation Level:  Active  Description of Group: This group will address the use of boundaries in their personal lives. Patients will explore why boundaries are important, the difference between healthy and unhealthy boundaries, and negative and postive outcomes of different boundaries and will look at how boundaries can be crossed.  Patients will be encouraged to identify current boundaries in their own lives and identify what kind of boundary is being set. Facilitators will guide patients in utilizing problem-solving interventions to address and correct types boundaries being used and to address when no boundary is being used. Understanding and applying boundaries will be explored and addressed for obtaining and maintaining a balanced life. Patients will be encouraged to explore ways to assertively make their boundaries and needs known to significant others in their lives, using other group members and facilitator for role play, support, and feedback.  Therapeutic Goals:  1.  Patient will identify areas in their life where setting clear boundaries could be  used to improve their life.  2.  Patient will identify signs/triggers that a boundary is not being respected. 3.  Patient will identify two ways to set boundaries in order to achieve balance in  their lives: 4.  Patient will demonstrate ability to communicate their needs and set boundaries  through discussion and/or role plays  Summary of Patient Progress:  She was present/active throughout the session and proved open to feedback from CSW and peers. Patient demonstrated great insight into the subject matter, was respectful of peers, and was present throughout the entire session.  Therapeutic Modalities:   Cognitive Behavioral Therapy Solution-Focused Therapy  Golda Louder, LCSWA 03/30/2024   4:00 PM

## 2024-03-30 NOTE — Group Note (Signed)
 Date:  03/30/2024 Time:  10:12 AM  Group Topic/Focus:  Goals Group:   The focus of this group is to help patients establish daily goals to achieve during treatment and discuss how the patient can incorporate goal setting into their daily lives to aide in recovery. Orientation:   The focus of this group is to educate the patient on the purpose and policies of crisis stabilization and provide a format to answer questions about their admission.  The group details unit policies and expectations of patients while admitted.    Participation Level:  Active  Participation Quality:  Appropriate, Sharing, and Supportive  Affect:  Appropriate  Cognitive:  Appropriate  Insight: Appropriate  Engagement in Group:  Engaged and Supportive  Modes of Intervention:  Discussion  Additional Comments:    Zhara Gieske R Alvina Strother 03/30/2024, 10:12 AM

## 2024-03-30 NOTE — Progress Notes (Signed)
 [facility] MD Progress Note  03/30/2024 10:05 AM Elizabeth Cameron  MRN:  [id number]  Principal Problem: MDD (major depressive disorder), recurrent episode, severe (HCC) Diagnosis: Principal Problem:   MDD (major depressive disorder), recurrent episode, severe (HCC)  Reason for admission:  Elizabeth Cameron is a 28 year old African-American female with prior psychiatric diagnoses significant for MDD recurrent severe, bipolar affective disorder, mixed severe, cannabis abuse, ADHD combined type, oppositional defiant disorder, PTSD, and insomnia.  Patient presents to [facility] from [facility] behavioral health urgent care for worsening depression resulting in suicidal ideation with specific plans to harm herself when her 3 children are at school in the context of multi-family member deaths within the past 5 years.  BAL: Less than 15, UDS: Negative for any substances.   24-hour chart review: The patient's chart was reviewed and nursing notes were reviewed. Significant vitals signs: stable. The patient's case was discussed in multidisciplinary team meeting. Per [facility], patient was taking medications appropriately . The following as needed medications were given: trazodone 1x. Per nursing, patient is calm and cooperative and attended  group sessions.    Today's assessment notes:  The patient was seen in the milieu, no acute distress. On assessment, the patient feels good today. Patient feels the group sessions have been helpful regarding learning more coping skills to communicate and listen more effectively. When asked about speaking with family, she states that she spoke with her kids and her mom.   Patient reports having disrupted sleep, reporting issues staying asleep and feels that this is due to taking the as needed hydroxyzine .  I discussed that if she is needing as needed medications at night that she can request to only receive trazodone.  Patient reports good appetite.  She  is also reporting itching in her vaginal region for the past 2 days and feels that this is ringworm.  She denies urinary incontinence or dysuria and she denies abnormal discharge.  I discussed that we will optimize her Remeron and discontinue Zoloft at this time.  Patient denies current SI, HI, AVH.  Total Time spent with patient: 45 minutes  Past Psychiatric History: Previous Psych Diagnoses: MDD recurrent severe, bipolar disorder mixed, ADHD, ODD, PTSD, and insomnia Prior inpatient treatment: Yes, at [facility] child and adolescent unit in 2011 Current/prior outpatient treatment: Yes, currently receiving [facility] and therapy at [facility] Prior rehab hx: Denies Psychotherapy hx: Yes History of suicide: Denies History of homicide or aggression: Denies Psychiatric medication history: Patient has been on trial lamotrigine, prazosin Psychiatric medication compliance history: Yes Neuromodulation history: Denies Current Psychiatrist: Yes, at [facility] Current therapist: Yes, [facility]  Past Medical History:  Past Medical History:  Diagnosis Date   ADHD (attention deficit hyperactivity disorder)    vyvanse , none in a couple of weeks due to running out   Anxiety    Bipolar 1 disorder (HCC)    Depression    Hypertension    takes Procardia 30mg    Varicella    remote history    Past Surgical History:  Procedure Laterality Date   ROOT CANAL     Family History:  Family History  Problem Relation Age of Onset   Depression Mother        Lorazepam, prestige   Anxiety disorder Mother    Bipolar disorder Mother        seroquel   Hypertension Father    Depression Father    Anxiety disorder Father  Heart disease Father    Hypertension Maternal Grandmother    Diabetes Maternal Grandmother    Hypertension Maternal Grandfather    Diabetes Maternal Grandfather    Schizophrenia Paternal Grandmother    Dementia Paternal  Grandmother    Family Psychiatric  History: See H&P  Social History:  Social History   Substance and Sexual Activity  Alcohol Use Not Currently   Comment: occ     Social History   Substance and Sexual Activity  Drug Use Not Currently   Types: Marijuana   Comment: occasional use.  2 blunts.    Social History   Socioeconomic History   Marital status: Single    Spouse name: Not on file   Number of children: Not on file   Years of education: Not on file   Highest education level: Not on file  Occupational History   Occupation: student    Comment: rising 11th grade.    Tobacco Use   Smoking status: Former    Current packs/day: 0.10    Average packs/day: 0.1 packs/day for 0.6 years (0.1 ttl pk-yrs)    Types: Cigarettes    Passive exposure: Never   Smokeless tobacco: Never   Tobacco comments:    Gets nauseous when she smokes too much.   Interested in engineer, maintenance (it) for anxiety, stress  Vaping Use   Vaping status: Never Used  Substance and Sexual Activity   Alcohol use: Not Currently    Comment: occ   Drug use: Not Currently    Types: Marijuana    Comment: occasional use.  2 blunts.   Sexual activity: Yes    Partners: Male    Birth control/protection: Condom, Patch  Other Topics Concern   Not on file  Social History Narrative   Wants to be a warden/ranger.  Wants to go college.     Social Drivers of Corporate Investment Banker Strain: Not on File (09/23/2021)   Received from [employer]    Financial Resource Strain: 0  Food Insecurity: No Food Insecurity (03/27/2024)   Hunger Vital Sign    Worried About Running Out of Food in the Last Year: Never true    Ran Out of Food in the Last Year: Never true  Transportation Needs: Unmet Transportation Needs (03/27/2024)   PRAPARE - Administrator, Civil Service (Medical): Yes    Lack of Transportation (Non-Medical): Yes  Physical Activity: Not on File (09/23/2021)   Received  from [facility]   Physical Activity    Physical Activity: 0  Stress: Not on File (09/23/2021)   Received from [facility]   Stress    Stress: 0  Social Connections: Not on File (02/13/2023)   Received from [employer]   Social Connections    Connectedness: 0    Current Medications: Current Facility-Administered Medications  Medication Dose Route Frequency Provider Last Rate Last Admin   albuterol  (VENTOLIN  HFA) 108 (90 Base) MCG/ACT inhaler 1-2 puff  1-2 puff Inhalation Q6H PRN Ntuen, Tina C, FNP       clotrimazole (GYNE-LOTRIMIN) vaginal cream 1 Applicatorful  1 Applicatorful Vaginal QHS Telissa Palmisano B, MD       haloperidol (HALDOL) tablet 5 mg  5 mg Oral TID PRN Onuoha, Chinwendu V, NP       And   diphenhydrAMINE (BENADRYL) capsule 50 mg  50 mg Oral TID PRN Onuoha, Chinwendu V, NP       haloperidol lactate (HALDOL) injection 5 mg  5  mg Intramuscular TID PRN Onuoha, Chinwendu V, NP       And   diphenhydrAMINE (BENADRYL) injection 50 mg  50 mg Intramuscular TID PRN Onuoha, Chinwendu V, NP       And   LORazepam (ATIVAN) injection 2 mg  2 mg Intramuscular TID PRN Onuoha, Chinwendu V, NP       haloperidol lactate (HALDOL) injection 10 mg  10 mg Intramuscular TID PRN Onuoha, Chinwendu V, NP       And   diphenhydrAMINE (BENADRYL) injection 50 mg  50 mg Intramuscular TID PRN Onuoha, Chinwendu V, NP       And   LORazepam (ATIVAN) injection 2 mg  2 mg Intramuscular TID PRN Onuoha, Chinwendu V, NP       gabapentin (NEURONTIN) capsule 300 mg  300 mg Oral BID Ntuen, Tina C, FNP   300 mg at 03/30/24 9241   hydrOXYzine  (ATARAX ) tablet 25 mg  25 mg Oral TID PRN Onuoha, Chinwendu V, NP   25 mg at 03/28/24 2105   lamoTRIgine (LAMICTAL) tablet 200 mg  200 mg Oral Daily Ntuen, Tina C, FNP   200 mg at 03/30/24 0758   mirtazapine (REMERON) tablet 30 mg  30 mg Oral QHS Shavonna Corella B, MD       NIFEdipine (PROCARDIA-XL/NIFEDICAL-XL) 24 hr tablet 60 mg  60 mg Oral Daily Ntuen, Tina C, FNP   60 mg at 03/30/24 0758    ondansetron  (ZOFRAN -ODT) disintegrating tablet 4 mg  4 mg Oral Q8H PRN Ntuen, Tina C, FNP   4 mg at 03/28/24 1141   prazosin (MINIPRESS) capsule 5 mg  5 mg Oral QHS Ntuen, Tina C, FNP   5 mg at 03/29/24 2121   traZODone (DESYREL) tablet 50 mg  50 mg Oral QHS PRN Onuoha, Chinwendu V, NP   50 mg at 03/29/24 2122   Lab Results:  No results found for this or any previous visit (from the past 48 hours).   Blood Alcohol level:  Lab Results  Component Value Date   [facility] <15 03/26/2024   ETH <11 12/08/2011    Metabolic Disorder Labs: Lab Results  Component Value Date   HGBA1C 5.2 03/26/2024   MPG 102.54 03/26/2024   MPG 105 12/12/2011   No results found for: PROLACTIN Lab Results  Component Value Date   CHOL 211 (H) 03/26/2024   TRIG 67 03/26/2024   HDL 73 03/26/2024   CHOLHDL 2.9 03/26/2024   VLDL 13 03/26/2024   LDLCALC 125 (H) 03/26/2024   LDLCALC 59 12/12/2011   Physical Findings: AIMS:  ,  ,  ,  ,  ,  ,   CIWA:    COWS:     Musculoskeletal: Strength & Muscle Tone: within normal limits Gait & Station: normal Patient leans: N/A  Psychiatric Specialty Exam:  Presentation  General Appearance:  Appropriate for Environment; Casual; Fairly Groomed  Eye Contact: Good  Speech: Clear and Coherent; Normal Rate  Speech Volume: Normal  Handedness: Right  Mood and Affect  Mood: Euthymic  Affect: Congruent; Appropriate  Thought Process  Thought Processes: Coherent; Goal Directed; Linear  Descriptions of Associations:Intact  Orientation:Full (Time, Place and Person)  Thought Content:Logical  History of Schizophrenia/Schizoaffective disorder:No  Duration of Psychotic Symptoms:NA Hallucinations:Hallucinations: None  Ideas of Reference:None  Suicidal Thoughts:Suicidal Thoughts: No SI Active Intent and/or Plan: -- (Denies) SI Passive Intent and/or Plan: -- (Denies)  Homicidal Thoughts:Homicidal Thoughts: No  Sensorium  Memory: Immediate Good;  Recent Good; Remote Good  Judgment:  Fair  Insight: Fair  Chartered Certified Accountant: Good  Attention Span: Good  Recall: Metta Abe of Knowledge: Good  Language: Good  Psychomotor Activity  Psychomotor Activity: Psychomotor Activity: Normal  Assets  Assets: Communication Skills; Desire for Improvement; Resilience  Sleep  Sleep: Sleep: Fair Number of Hours of Sleep: 7.5  Physical Exam  General: Pleasant, well-appearing . No acute distress. Pulmonary: Normal effort. No wheezing or rales. Skin: No obvious rash or lesions. Neuro: A&Ox3.No focal deficit.  Review of Systems  Pruritus on genital region  Blood pressure 126/69, pulse 63, temperature 99.1 F (37.3 C), temperature source Oral, resp. rate 17, SpO2 99%. There is no height or weight on file to calculate BMI.  Treatment Plan Summary: Daily contact with patient to assess and evaluate symptoms and progress in treatment and Medication management  Observation Level/Precautions:  15 minute checks  Laboratory:   CBC: Within normal limits Chemistry Profile: CO2 21 low, creatinine 1.06 high, otherwise normal Folic Acid: N/A GGT: N/A HbAIC: N/A HCG: Urine pregnancy test negative UDS: Negative for substances UA: Colitis cloudy, protein 30, bacteria many. Vitamin B-12: NA  Psychotherapy: Therapeutic milieu  Medications: See MAR  Consultations: None  Discharge Concerns: Safety  Estimated LOS: 3 to 7 days  Other:      Assessment: Elizabeth Cameron is a 28 year old African-American female with prior psychiatric diagnoses significant for MDD recurrent severe, bipolar affective disorder mixed severe, cannabis abuse, ADHD combined type, oppositional defiant disorder, PTSD, and insomnia.  Patient presents to [facility] from [facility] behavioral health urgent care for worsening depression resulting in suicidal ideation with specific plans to harm herself when her 3 children  are at school in the context of multi-family deaths within the past 5 years.    Patient appears to be doing fairly today.  Discussed that she is on multiple psychotropic medications and to avoid polypharmacy, will discontinue Zoloft and optimize Remeron at this time.  For her sleep, will focus on using the as needed trazodone and reiterated that she is also on prazosin which may have its own sedating properties.  Will also start clotrimazole cream for her genital pruritus.   Physician Treatment Plan for Primary Diagnosis: MDD (major depressive disorder), recurrent episode, severe (HCC)   Plans: Psychotropic medications - Discontinue Zoloft -- Increase Remeron from 15 to 30 mg p.o. daily for depression and sleep  -- Continue Lamictal 200 mg p.o. daily for mood stabilization -- Continue prazosin 5 mg capsule p.o. daily at bedtime for nightmares -- Continue gabapentin 300 mg tablet p.o. twice daily for anxiety -- Continue trazodone tablet 50 mg p.o. q. nightlyPRN at bedtime for insomnia -- Continue hydroxyzine  tablet 25 mg p.o. 3 times daily as needed for anxiety   -- Hold Vyvanse  20 mg capsule p.o. daily for ADHD  Continue BH Agitation Protocol    Medications for other medical conditions: - Start clotrimazole vaginal cream daily at night for 7 doses for genital pruritus -- Continue ventilator HFA 108 90 base micrograms/ACT inhaler 1 to 2 puffs every 6 hours as needed for wheezing shortness of breath -- Continue Procardia XL 24-hour tablet 60 mg p.o. daily for high blood pressure   Other PRN Medications  -Acetaminophen  650 mg every 6 as needed/mild pain  -Maalox 30 mL oral every 4 as needed/digestion  -Magnesium hydroxide 30 mL daily as needed/mild constipation  --Zofran  disintegrating tab 4 mg p.o. every 8 hours as needed for nausea and vomiting   --The risks/benefits/side-effects/alternatives  to this medication were discussed in detail with the patient and time was given for questions.  The patient consents to medication trial.   -- Metabolic profile and EKG monitoring obtained while on an atypical antipsychotic (BMI: Lipid Panel: HbgA1c: QTc:)   -- Encouraged patient to participate in unit milieu and in scheduled group therapies    Safety and Monitoring:  Voluntary admission to inpatient psychiatric unit for safety, stabilization and treatment  Daily contact with patient to assess and evaluate symptoms and progress in treatment  Patient's case to be discussed in multi-disciplinary team meeting  Observation Level : q15 minute checks  Vital signs: q12 hours  Precautions: suicide, but pt currently verbally contracts for safety on unit?    Discharge Planning:  Social work and case management to assist with discharge planning and identification of hospital follow-up needs prior to discharge  Estimated LOS: 5-7?days  Discharge Concerns: Need to establish Safety plan; Medication compliance and effectiveness  Discharge Goals: Return home with outpatient referrals for mental health follow-up including medication management/psychotherapy.      Physician Treatment Plan for Secondary Diagnosis: Principal Problem:   MDD (major depressive disorder), recurrent episode, severe (HCC)   I certify that inpatient services furnished can reasonably be expected to improve the patient's condition.  Ismael KATHEE Franco, MD 03/30/2024, 10:05 AM Patient ID: Elizabeth Cameron, female   DOB: 04/03/1996, 28 y.o.   MRN: [id number]

## 2024-03-30 NOTE — Group Note (Signed)
 Date:  03/30/2024 Time:  5:40 PM  Group Topic/Focus:  Healthy Communication:   The focus of this group is to discuss communication, barriers to communication, as well as healthy ways to communicate with others. We went over the 5 love languages in the lesson book provided.    Participation Level:  Active  Participation Quality:  Appropriate  Affect:  Appropriate  Cognitive:  Appropriate  Insight: Appropriate  Engagement in Group:  Engaged  Modes of Intervention:  Discussion and Education  Additional Comments:    Elizabeth Cameron 03/30/2024, 5:40 PM

## 2024-03-30 NOTE — Group Note (Signed)
 Date:  03/30/2024 Time:  12:53 PM  Group Topic/Focus: Social Work   Pt did not attend social work group   Dory Demont R Ainslee Sou 03/30/2024, 12:53 PM

## 2024-03-31 MED ORDER — WHITE PETROLATUM EX OINT
TOPICAL_OINTMENT | CUTANEOUS | Status: AC
  Filled 2024-03-31: qty 5

## 2024-03-31 NOTE — [facility] Group Notes (Signed)
 [facility] Group Notes:  (Nursing/MHT/Case Management/Adjunct)  Date:  03/31/2024  Time:  9:49 PM  Type of Therapy:  Wrap up group   Participation Level:  Active  Participation Quality:  Appropriate  Affect:  Appropriate  Cognitive:  Appropriate  Insight:  Appropriate  Engagement in Group:  Engaged  Modes of Intervention:  Education  Summary of Progress/Problems:Appropriate   Elizabeth Cameron 03/31/2024, 9:49 PM

## 2024-03-31 NOTE — Group Note (Deleted)
 Date:  03/31/2024 Time:  10:42 AM  Group Topic/Focus:  Goals Group:   The focus of this group is to help patients establish daily goals to achieve during treatment and discuss how the patient can incorporate goal setting into their daily lives to aide in recovery.     Participation Level:  {[facility] PARTICIPATION OZCZO:77735}  Participation Quality:  {[facility] PARTICIPATION QUALITY:22265}  Affect:  {[facility]HH AFFECT:22266}  Cognitive:  {[facility] COGNITIVE:22267}  Insight: {BHH Insight2:20797}  Engagement in Group:  {BHH ENGAGEMENT IN HMNLE:77731}  Modes of Intervention:  {BHH MODES OF INTERVENTION:22269}  Additional Comments:  ***  Elizabeth Cameron 03/31/2024, 10:42 AM

## 2024-03-31 NOTE — Plan of Care (Signed)

## 2024-03-31 NOTE — Plan of Care (Signed)
(  Sleep Hours) -6 (Any PRNs that were needed, meds refused, or side effects to meds)- trazodone 50 mg for sleep.  Medication was effective (Any disturbances and when (visitation, over night)- none reported (Concerns raised by the patient)- none reported (SI/HI/AVH)- Denies all Problem: Education: Goal: Knowledge of [facility] Education information/materials will improve Outcome: Progressing Goal: Emotional status will improve Outcome: Progressing Goal: Mental status will improve Outcome: Progressing   Problem: Physical Regulation: Goal: Ability to maintain clinical measurements within normal limits will improve Outcome: Progressing   Problem: Safety: Goal: Periods of time without injury will increase Outcome: Progressing

## 2024-03-31 NOTE — Group Note (Signed)
 Date:  03/31/2024 Time:  6:58 PM  Group Topic/Focus:  Goals Group:   The focus of this group is to help patients establish daily goals to achieve during treatment and discuss how the patient can incorporate goal setting into their daily lives to aide in recovery. Self Care:   The focus of this group is to help patients understand the importance of self-care in order to improve or restore emotional, physical, spiritual, interpersonal, and financial health. Spirituality:   The focus of this group is to discuss how one's spirituality can aide in recovery.    Participation Level:  Active  Participation Quality:  Appropriate  Affect:  Appropriate  Cognitive:  Appropriate  Insight: Appropriate  Engagement in Group:  Engaged  Modes of Intervention:  Discussion and Education  Additional Comments:    Juliene CHRISTELLA Huddle 03/31/2024, 6:58 PM

## 2024-03-31 NOTE — Group Note (Signed)
 Date:  03/31/2024 Time:  3:46 PM  Group Topic/Focus:  Bingo - Positive Socialization  The group engaged in a [employer] designed to encourage positive socialization, promote a supportive environment, and enhance cognitive skills. Patients participated actively, demonstrating good communication and cooperation with peers. The activity was structured to create a relaxed, enjoyable atmosphere, fostering a sense of community among participants.  Participation Level:  Active  Participation Quality:  Appropriate and Attentive  Affect:  Appropriate  Cognitive:  Alert and Appropriate  Insight: Appropriate and Improving  Engagement in Group:  Engaged and Improving  Modes of Intervention:  Activity, Problem-solving, and Socialization  Additional Comments:  Shakayla attended and actively participated in bingo. Winner x2!  Kristi HERO Aleece Loyd 03/31/2024, 3:46 PM

## 2024-03-31 NOTE — Progress Notes (Signed)
   03/31/24 0821  Psychosocial Assessment  Patient Complaints Anxiety  Eye Contact Fair  Facial Expression Anxious  Affect Apathetic  Speech Logical/coherent  Interaction Assertive  Motor Activity Other (Comment) (WDL)  Appearance/Hygiene Unremarkable  Behavior Characteristics Appropriate to situation  Mood Pleasant;Sullen  Thought Process  Coherency WDL  Content WDL  Delusions None reported or observed  Perception WDL  Hallucination None reported or observed  Judgment Poor  Confusion None  Danger to Self  Current suicidal ideation? Denies  Self-Injurious Behavior No self-injurious ideation or behavior indicators observed or expressed   Agreement Not to Harm Self Yes  Description of Agreement Verbal  Danger to Others  Danger to Others None reported or observed

## 2024-03-31 NOTE — Progress Notes (Signed)
 Shift Note  (Sleep Hours) - 7  (Any PRNs that were needed, meds refused, or side effects to meds)-  PO PRN Hydroxyzine  and Trazodone    (Any disturbances and when (visitation, over night)- None  (Concerns raised by the patient)- None  (SI/HI/AVH)- Denies

## 2024-03-31 NOTE — Group Note (Signed)
 Date:  03/31/2024 Time:  10:25 AM  Group Topic/Focus:  Goals Group:   The focus of this group is to help patients establish daily goals to achieve during treatment and discuss how the patient can incorporate goal setting into their daily lives to aide in recovery.    Participation Level:  Active  Participation Quality:  Appropriate, Attentive, and Sharing  Affect:  Appropriate  Cognitive:  Alert and Appropriate  Insight: Appropriate and Improving  Engagement in Group:  Engaged and Improving  Modes of Intervention:  Discussion, Exploration, and Socialization  Additional Comments:  Elizabeth Cameron attended and actively participated in goals group.  Kristi HERO Edgar Corrigan 03/31/2024, 10:25 AM

## 2024-03-31 NOTE — Group Note (Signed)
 Date:  03/31/2024 Time:  11:45 AM  Group Topic/Focus:  Ted Talk & Group Reflection  This group watched a TED Talk on resilience, focusing on overcoming adversity and developing strength through challenges. The talk highlighted the importance of hope, perseverance, and learning from setbacks.  After the video, patient's shared what stood out to them and connected the concept of resilience to their own experiences.Patient's identified strengths and strategies they use to build resilience, such as seeking support and self-care. The group set goals for building resilience, encouraging small, actionable steps moving forward.  The session ended with a focus on self-compassion and practical ways to strengthen resilience. Participants reflected on past challenges they've overcome and were encouraged to take positive steps in the coming week.  Participation Level:  Active  Participation Quality:  Appropriate and Attentive  Affect:  Appropriate  Cognitive:  Alert and Appropriate  Insight: Appropriate and Improving  Engagement in Group:  Engaged and Improving  Modes of Intervention:  Discussion, Education, Exploration, and Socialization  Additional Comments:  Lucillie attended and actively participated in this wellness group.  Kristi HERO Stamatia Masri 03/31/2024, 11:45 AM

## 2024-03-31 NOTE — Progress Notes (Signed)
 [facility] MD Progress Note  03/31/2024 9:52 AM Zerah Hilyer  MRN:  [id number]  Principal Problem: MDD (major depressive disorder), recurrent episode, severe (HCC) Diagnosis: Principal Problem:   MDD (major depressive disorder), recurrent episode, severe (HCC)  Reason for admission:  Tanner [place] is a 28 year old African-American female with prior psychiatric diagnoses significant for MDD recurrent severe, bipolar affective disorder, mixed severe, cannabis abuse, ADHD combined type, oppositional defiant disorder, PTSD, and insomnia.  Patient presents to [facility] from [facility] behavioral health urgent care for worsening depression resulting in suicidal ideation with specific plans to harm herself when her 3 children are at school in the context of multi-family member deaths within the past 5 years.    24 hour chart review The patient's chart was reviewed and nursing notes were reviewed. Significant vitals signs: stable. The patient's case was discussed in multidisciplinary team meeting. Per [facility], patient was taking medications appropriately. The following as needed medications were given: trazodone. Per nursing, patient is calm and cooperative and attended group sessions.    Today's assessment notes:  The patient was seen in the milieu, no acute distress. On assessment, the patient feels good today. Patient feels the group sessions have been helpful and she is learning more about forgiving.   Patient reports having good sleep.  Patient reports good appetite. Patient feels that the medications have been helpful regarding her mood.  She notes that the clotrimazole has helped with the genital pruritus.  Patient denies current SI, HI, AVH.  Past Psychiatric History: Previous Psych Diagnoses: MDD recurrent severe, bipolar disorder mixed, ADHD, ODD, PTSD, and insomnia Prior inpatient treatment: Yes, at [facility] child and adolescent unit in 2011 Current/prior outpatient  treatment: Yes, currently receiving [facility] and therapy at [facility] Prior rehab hx: Denies Psychotherapy hx: Yes History of suicide: Denies History of homicide or aggression: Denies Psychiatric medication history: Patient has been on trial lamotrigine, prazosin Psychiatric medication compliance history: Yes Neuromodulation history: Denies Current Psychiatrist: Yes, at [facility] Current therapist: Yes, [facility]  Past Medical History:  Past Medical History:  Diagnosis Date   ADHD (attention deficit hyperactivity disorder)    vyvanse , none in a couple of weeks due to running out   Anxiety    Bipolar 1 disorder (HCC)    Depression    Hypertension    takes Procardia 30mg    Varicella    remote history    Past Surgical History:  Procedure Laterality Date   ROOT CANAL     Family History:  Family History  Problem Relation Age of Onset   Depression Mother        Lorazepam, prestige   Anxiety disorder Mother    Bipolar disorder Mother        seroquel   Hypertension Father    Depression Father    Anxiety disorder Father    Heart disease Father    Hypertension Maternal Grandmother    Diabetes Maternal Grandmother    Hypertension Maternal Grandfather    Diabetes Maternal Grandfather    Schizophrenia Paternal Grandmother    Dementia Paternal Grandmother    Family Psychiatric  History: See H&P  Social History:  Social History   Substance and Sexual Activity  Alcohol Use Not Currently   Comment: occ     Social History   Substance and Sexual Activity  Drug Use Not Currently   Types: Marijuana   Comment: occasional use.  2 blunts.    Social  History   Socioeconomic History   Marital status: Single    Spouse name: Not on file   Number of children: Not on file   Years of education: Not on file   Highest education level: Not on file  Occupational History   Occupation: student    Comment: rising  11th grade.    Tobacco Use   Smoking status: Former    Current packs/day: 0.10    Average packs/day: 0.1 packs/day for 0.6 years (0.1 ttl pk-yrs)    Types: Cigarettes    Passive exposure: Never   Smokeless tobacco: Never   Tobacco comments:    Gets nauseous when she smokes too much.   Interested in engineer, maintenance (it) for anxiety, stress  Vaping Use   Vaping status: Never Used  Substance and Sexual Activity   Alcohol use: Not Currently    Comment: occ   Drug use: Not Currently    Types: Marijuana    Comment: occasional use.  2 blunts.   Sexual activity: Yes    Partners: Male    Birth control/protection: Condom, Patch  Other Topics Concern   Not on file  Social History Narrative   Wants to be a warden/ranger.  Wants to go college.     Social Drivers of Corporate Investment Banker Strain: Not on File (09/23/2021)   Received from [employer]    Financial Resource Strain: 0  Food Insecurity: No Food Insecurity (03/27/2024)   Hunger Vital Sign    Worried About Running Out of Food in the Last Year: Never true    Ran Out of Food in the Last Year: Never true  Transportation Needs: Unmet Transportation Needs (03/27/2024)   PRAPARE - Administrator, Civil Service (Medical): Yes    Lack of Transportation (Non-Medical): Yes  Physical Activity: Not on File (09/23/2021)   Received from [facility]   Physical Activity    Physical Activity: 0  Stress: Not on File (09/23/2021)   Received from [facility]   Stress    Stress: 0  Social Connections: Not on File (02/13/2023)   Received from [employer]   Social Connections    Connectedness: 0    Current Medications: Current Facility-Administered Medications  Medication Dose Route Frequency Provider Last Rate Last Admin   albuterol  (VENTOLIN  HFA) 108 (90 Base) MCG/ACT inhaler 1-2 puff  1-2 puff Inhalation Q6H PRN Ntuen, Tina C, FNP       clotrimazole (GYNE-LOTRIMIN) vaginal cream 1 Applicatorful  1 Applicatorful  Vaginal QHS Jachelle Fluty B, MD   1 Applicatorful at 03/30/24 2113   haloperidol (HALDOL) tablet 5 mg  5 mg Oral TID PRN Onuoha, Chinwendu V, NP       And   diphenhydrAMINE (BENADRYL) capsule 50 mg  50 mg Oral TID PRN Onuoha, Chinwendu V, NP       haloperidol lactate (HALDOL) injection 5 mg  5 mg Intramuscular TID PRN Onuoha, Chinwendu V, NP       And   diphenhydrAMINE (BENADRYL) injection 50 mg  50 mg Intramuscular TID PRN Onuoha, Chinwendu V, NP       And   LORazepam (ATIVAN) injection 2 mg  2 mg Intramuscular TID PRN Onuoha, Chinwendu V, NP       haloperidol lactate (HALDOL) injection 10 mg  10 mg Intramuscular TID PRN Onuoha, Chinwendu V, NP       And   diphenhydrAMINE (BENADRYL) injection 50 mg  50 mg Intramuscular TID PRN Onuoha,  Chinwendu V, NP       And   LORazepam (ATIVAN) injection 2 mg  2 mg Intramuscular TID PRN Onuoha, Chinwendu V, NP       gabapentin (NEURONTIN) capsule 300 mg  300 mg Oral BID Ntuen, Tina C, FNP   300 mg at 03/31/24 9178   hydrOXYzine  (ATARAX ) tablet 25 mg  25 mg Oral TID PRN Onuoha, Chinwendu V, NP   25 mg at 03/28/24 2105   lamoTRIgine (LAMICTAL) tablet 200 mg  200 mg Oral Daily Ntuen, Tina C, FNP   200 mg at 03/31/24 9178   mirtazapine (REMERON) tablet 30 mg  30 mg Oral QHS Mayukha Symmonds B, MD   30 mg at 03/30/24 2111   NIFEdipine (PROCARDIA-XL/NIFEDICAL-XL) 24 hr tablet 60 mg  60 mg Oral Daily Ntuen, Tina C, FNP   60 mg at 03/31/24 9176   ondansetron  (ZOFRAN -ODT) disintegrating tablet 4 mg  4 mg Oral Q8H PRN Ntuen, Tina C, FNP   4 mg at 03/28/24 1141   prazosin (MINIPRESS) capsule 5 mg  5 mg Oral QHS Ntuen, Tina C, FNP   5 mg at 03/30/24 2111   traZODone (DESYREL) tablet 50 mg  50 mg Oral QHS PRN Onuoha, Chinwendu V, NP   50 mg at 03/30/24 2112   Lab Results:  No results found for this or any previous visit (from the past 48 hours).   Blood Alcohol level:  Lab Results  Component Value Date   [facility] <15 03/26/2024   ETH <11 12/08/2011    Metabolic  Disorder Labs: Lab Results  Component Value Date   HGBA1C 5.2 03/26/2024   MPG 102.54 03/26/2024   MPG 105 12/12/2011   No results found for: PROLACTIN Lab Results  Component Value Date   CHOL 211 (H) 03/26/2024   TRIG 67 03/26/2024   HDL 73 03/26/2024   CHOLHDL 2.9 03/26/2024   VLDL 13 03/26/2024   LDLCALC 125 (H) 03/26/2024   LDLCALC 59 12/12/2011   Physical Findings: AIMS:  ,  ,  ,  ,  ,  ,   CIWA:    COWS:     Musculoskeletal: Strength & Muscle Tone: within normal limits Gait & Station: normal Patient leans: N/A  Psychiatric Specialty Exam:  Presentation  General Appearance:  Appropriate for Environment; Casual; Fairly Groomed  Eye Contact: Good  Speech: Clear and Coherent; Normal Rate  Speech Volume: Normal  Handedness: Right  Mood and Affect  Mood: Euthymic  Affect: Congruent; Appropriate; Full Range  Thought Process  Thought Processes: Coherent; Goal Directed; Linear  Descriptions of Associations:Intact  Orientation:Full (Time, Place and Person)  Thought Content:Logical  History of Schizophrenia/Schizoaffective disorder:No  Duration of Psychotic Symptoms:NA Hallucinations:Hallucinations: None  Ideas of Reference:None  Suicidal Thoughts:Suicidal Thoughts: No  Homicidal Thoughts:Homicidal Thoughts: No  Sensorium  Memory: Immediate Good; Recent Good; Remote Good  Judgment: Good  Insight: Good  Executive Functions  Concentration: Good  Attention Span: Good  Recall: Good  Fund of Knowledge: Good  Language: Good  Psychomotor Activity  Psychomotor Activity: Psychomotor Activity: Normal  Assets  Assets: Communication Skills; Desire for Improvement; Resilience  Sleep  Sleep: Sleep: Fair  Physical Exam  General: Pleasant, well-appearing . No acute distress. Pulmonary: Normal effort. No wheezing or rales. Skin: No obvious rash or lesions. Neuro: A&Ox3.No focal deficit.  Review of Systems  None  reported today  Blood pressure 120/74, pulse 70, temperature 98.3 F (36.8 C), temperature source Oral, resp. rate 18, SpO2 99%. There is no  height or weight on file to calculate BMI.  Treatment Plan Summary: Daily contact with patient to assess and evaluate symptoms and progress in treatment and Medication management  Observation Level/Precautions:  15 minute checks  Laboratory:   CBC: Within normal limits Chemistry Profile: CO2 21 low, creatinine 1.06 high, otherwise normal Folic Acid: N/A GGT: N/A HbAIC: N/A HCG: Urine pregnancy test negative UDS: Negative for substances UA: Colitis cloudy, protein 30, bacteria many. Vitamin B-12: NA  Psychotherapy: Therapeutic milieu  Medications: See MAR  Consultations: None  Discharge Concerns: Safety  Estimated LOS: 3 to 7 days  Other:      Assessment: Kirra Verga is a 28 year old African-American female with prior psychiatric diagnoses significant for MDD recurrent severe, bipolar affective disorder mixed severe, cannabis abuse, ADHD combined type, oppositional defiant disorder, PTSD, and insomnia.  Patient presents to [facility] from [facility] behavioral health urgent care for worsening depression resulting in suicidal ideation with specific plans to harm herself when her 3 children are at school in the context of multi-family deaths within the past 5 years.    Patient appears to be doing fairly today.  Discussed that she is on multiple psychotropic medications and to avoid polypharmacy, will discontinue Zoloft and optimize Remeron at this time.  For her sleep, will focus on using the as needed trazodone and reiterated that she is also on prazosin which may have its own sedating properties.  Will also start clotrimazole cream for her genital pruritus.    Plans: Psychotropic medications -- Continue Remeron 30 mg p.o. daily for depression and sleep -- Continue Lamictal 200 mg p.o. daily for mood  stabilization -- Continue prazosin 5 mg capsule p.o. daily at bedtime for nightmares -- Continue gabapentin 300 mg tablet p.o. twice daily for anxiety -- Continue trazodone tablet 50 mg p.o. q. nightlyPRN at bedtime for insomnia -- Continue hydroxyzine  tablet 25 mg p.o. 3 times daily as needed for anxiety   - Discontinue Zoloft (optimizing Remeron instead) -- Hold Vyvanse  20 mg capsule p.o. daily for ADHD  Continue BH Agitation Protocol    Medications for other medical conditions: - Continue clotrimazole vaginal cream daily at night for 7 doses for genital pruritus -- Continue ventilator HFA 108 90 base micrograms/ACT inhaler 1 to 2 puffs every 6 hours as needed for wheezing shortness of breath -- Continue Procardia XL 24-hour tablet 60 mg p.o. daily for high blood pressure   Other PRN Medications  -Acetaminophen  650 mg every 6 as needed/mild pain  -Maalox 30 mL oral every 4 as needed/digestion  -Magnesium hydroxide 30 mL daily as needed/mild constipation  --Zofran  disintegrating tab 4 mg p.o. every 8 hours as needed for nausea and vomiting   --The risks/benefits/side-effects/alternatives to this medication were discussed in detail with the patient and time was given for questions. The patient consents to medication trial.   -- Encouraged patient to participate in unit milieu and in scheduled group therapies    Safety and Monitoring:  Voluntary admission to inpatient psychiatric unit for safety, stabilization and treatment  Daily contact with patient to assess and evaluate symptoms and progress in treatment  Patient's case to be discussed in multi-disciplinary team meeting  Observation Level : q15 minute checks  Vital signs: q12 hours  Precautions: suicide, but pt currently verbally contracts for safety on unit?    Discharge Planning:  Social work and case management to assist with discharge planning and identification of hospital follow-up needs prior to discharge  Estimated LOS:  5-7?days  Discharge Concerns: Need to establish Safety plan; Medication compliance and effectiveness  Discharge Goals: Return home with outpatient referrals for mental health follow-up including medication management/psychotherapy.      I certify that inpatient services furnished can reasonably be expected to improve the patient's condition.  Ismael KATHEE Franco, MD 03/31/2024, 9:52 AM Patient ID: Claude Champ, female   DOB: Apr 28, 1996, 28 y.o.   MRN: [id number]

## 2024-04-01 DIAGNOSIS — F332 Major depressive disorder, recurrent severe without psychotic features: Secondary | ICD-10-CM | POA: Diagnosis not present

## 2024-04-01 MED ORDER — MIRTAZAPINE 30 MG PO TABS: 30.0000 mg | ORAL_TABLET | Freq: Every day | ORAL | 0 refills | Status: AC

## 2024-04-01 MED ORDER — TRAZODONE HCL 50 MG PO TABS: 50.0000 mg | ORAL_TABLET | Freq: Every evening | ORAL | 0 refills | Status: AC | PRN

## 2024-04-01 MED ORDER — CLOTRIMAZOLE 1 % VA CREA: 1.0000 | TOPICAL_CREAM | Freq: Every day | VAGINAL | Status: AC

## 2024-04-01 MED ORDER — HYDROXYZINE HCL 25 MG PO TABS: 25.0000 mg | ORAL_TABLET | Freq: Three times a day (TID) | ORAL | 0 refills | Status: AC | PRN

## 2024-04-01 MED ORDER — PRAZOSIN HCL 5 MG PO CAPS: 5.0000 mg | ORAL_CAPSULE | Freq: Every day | ORAL | 0 refills | Status: AC

## 2024-04-01 MED ORDER — GABAPENTIN 300 MG PO CAPS: 300.0000 mg | ORAL_CAPSULE | Freq: Two times a day (BID) | ORAL | 0 refills | Status: AC

## 2024-04-01 MED ORDER — LAMOTRIGINE 200 MG PO TABS: 200.0000 mg | ORAL_TABLET | Freq: Every day | ORAL | 0 refills | Status: AC

## 2024-04-01 NOTE — Progress Notes (Signed)
  [facility] Adult Case Management Discharge Plan :  Will you be returning to the same living situation after discharge:  Yes,  patient will be discharging to address on file.  At discharge, do you have transportation home?: No. Patient will be receiving taxi voucher through [employer] at 10:30 am.  Do you have the ability to pay for your medications: Yes,  patient has active health insurance.   Release of information consent forms completed and in the chart;  Patient's signature needed at discharge.  Patient to Follow up at:  Follow-up Information     Sherril Camelia PARAS, NP Follow up on 04/08/2024.   Specialty: Behavioral Health Why: You have an appointment for medication management services on 04/08/24 at 10:15 am, Virtual telehealth. Contact information: [address] [place] [address] [phone]         [facility] Go on 04/02/2024.   Specialty: Professional Counselor Why: Please go to this provider on 04/02/24 at 9:00 am for an assessment, to obtain  services. You may also go Monday through Friday, from 9 am to 1 pm. Contact information: [address]  [address] [place] [address] [phone]         [facility]. Call.   Specialty: Hospice and Palliative Medicine Why: Please call this provider personally if you wish to schedule an appointment for grief/bereavement therapy services. Contact information: [address] [facility] [place]  [address] [phone]                Next level of care provider has access to [facility] Link:no  Safety Planning and Suicide Prevention discussed: Yes,  completed with patient. Suicide Prevention Education was reviewed thoroughly with patient, including risk factors, warning signs, and what to do. Mobile Crisis services were described and that telephone number pointed out, with encouragement to patient to put this number in personal cell phone. Brochure was  provided to patient to share with natural supports. Patient acknowledged the ways in which they are at risk, and how working through each of their issues can gradually start to reduce their risk factors. Patient was encouraged to think of the information in the context of people in their own lives. Patient denied having access to firearms Patient verbalized understanding of information provided. Patient endorsed a desire to live.      Has patient been referred to the Quitline?: Patient does not use tobacco/nicotine products  Patient has been referred for addiction treatment: No known substance use disorder.  Louetta Lame, LCSWA 04/01/2024, 9:28 AM

## 2024-04-01 NOTE — Plan of Care (Signed)
   Problem: Activity: Goal: Interest or engagement in activities will improve Outcome: Progressing   Problem: Coping: Goal: Ability to verbalize frustrations and anger appropriately will improve Outcome: Progressing

## 2024-04-01 NOTE — Group Note (Signed)
 Date:  04/01/2024 Time:  9:58 AM  Group Topic/Focus:  Goals Group:   The focus of this group is to help patients establish daily goals to achieve during treatment and discuss how the patient can incorporate goal setting into their daily lives to aide in recovery. Orientation:   The focus of this group is to educate the patient on the purpose and policies of crisis stabilization and provide a format to answer questions about their admission.  The group details unit policies and expectations of patients while admitted.    Participation Level:  Active  Participation Quality:  Appropriate  Affect:  Appropriate  Cognitive:  Appropriate  Insight: Appropriate  Engagement in Group:  Engaged  Modes of Intervention:  Discussion  Additional Comments:    Garet Hooton R Harith Mccadden 04/01/2024, 9:58 AM

## 2024-04-01 NOTE — Group Note (Signed)
 Date:  04/01/2024 Time:  10:52 AM  Group Topic/Focus: Recreational Therapy   Pt did attend recreational therapy group  Elizabeth Cameron 04/01/2024, 10:52 AM

## 2024-04-01 NOTE — Group Note (Signed)
 Recreation Therapy Group Note   Group Topic:Leisure Education  Group Date: 04/01/2024 Start Time: 0936 End Time: 1008 Facilitators: Glenden Rossell-McCall, LRT,CTRS Location: [address] Dayroom   Group Topic: Leisure Education  Goal Area(s) Addresses:  Patient will successfully identify positive leisure and recreation activities.  Patient will acknowledge benefits of participation in healthy leisure activities post discharge.  Patient will actively work with peers toward a shared goal.   Behavioral Response: Engaged   Intervention: Competitive Group Game    Activity: Keep It Contractor. Patients were spread out throughout the room and were to remain seated at all times. Patients were to hit the beach ball to each other as if playing volleyball. Patients were to keep the ball going for as long as they could. Patients would be timed throughout the activity and if the ball came to a complete stop, the time would start over.    Education:  Teacher, English As A Foreign Language, Leisure as Merchant Navy Officer, Programmer, Applications, Building Control Surveyor   Education Outcome: Acknowledges education/In group clarification offered/Needs additional education   Affect/Mood: Appropriate   Participation Level: Engaged   Participation Quality: Independent   Behavior: Appropriate   Speech/Thought Process: Focused   Insight: Good   Judgement: Good   Modes of Intervention: Cooperative Play   Patient Response to Interventions:  Engaged   Education Outcome:  In group clarification offered    Clinical Observations/Individualized Feedback: Pt was bright, social and engaged throughout group. Pt interacted well with peers and encouraging to them.     Plan: Continue to engage patient in RT group sessions 2-3x/week.   Haeden Hudock-McCall, LRT,CTRS 04/01/2024 11:35 AM

## 2024-04-01 NOTE — BHH Suicide Risk Assessment (Signed)
 Suicide Risk Assessment  Discharge Assessment    [facility] Discharge Suicide Risk Assessment   Principal Problem: MDD (major depressive disorder), recurrent episode, severe (HCC) Discharge Diagnoses: Principal Problem:   MDD (major depressive disorder), recurrent episode, severe (HCC)  Reason for Admission: Elizabeth Cameron is a 28 year old African-American female with prior psychiatric diagnoses significant for MDD recurrent severe, bipolar affective disorder, mixed severe, cannabis abuse, ADHD combined type, oppositional defiant disorder, PTSD, and insomnia.  Patient presents to [facility] from [facility] behavioral health urgent care for worsening depression resulting in suicidal ideation with specific plans to harm herself when her 3 children are at school in the context of multi-family member deaths within the past 5 years.   Total Time spent with patient: 30 minutes  Musculoskeletal: Strength & Muscle Tone: within normal limits Gait & Station: normal Patient leans: N/A  Psychiatric Specialty Exam  Presentation  General Appearance:  Casual  Eye Contact: Good  Speech: Clear and Coherent; Normal Rate  Speech Volume: Normal  Handedness: Right   Mood and Affect  Mood: Euthymic  Duration of Depression Symptoms: Greater than two weeks  Affect: Appropriate; Full Range   Thought Process  Thought Processes: Coherent; Linear  Descriptions of Associations:Intact  Orientation:Full (Time, Place and Person)  Thought Content:Logical  History of Schizophrenia/Schizoaffective disorder:No  Duration of Psychotic Symptoms:No data recorded Hallucinations:Hallucinations: None  Ideas of Reference:None  Suicidal Thoughts:Suicidal Thoughts: No SI Active Intent and/or Plan: -- (Denies) SI Passive Intent and/or Plan: -- (Denies)  Homicidal Thoughts:Homicidal Thoughts: No   Sensorium  Memory: Immediate Good; Recent Good; Remote  Good  Judgment: Intact  Insight: Present   Executive Functions  Concentration: Good  Attention Span: Good  Recall: Good  Fund of Knowledge: Good  Language: Good   Psychomotor Activity  Psychomotor Activity: Psychomotor Activity: Normal   Assets  Assets: Communication Skills; Desire for Improvement; Housing; Resilience; Social Support   Sleep  Sleep: Sleep: Good  Estimated Sleeping Duration (Last 24 Hours): 5.00-6.00 hours  Physical Exam: Physical Exam ROS Blood pressure 128/70, pulse 84, temperature 98.6 F (37 C), temperature source Oral, resp. rate 18, SpO2 100%. There is no height or weight on file to calculate BMI.  Mental Status Per Nursing Assessment::   On Admission:  NA  Demographic Factors:  Divorced or widowed  Loss Factors: Financial problems/change in socioeconomic status  Historical Factors: Family history of mental illness or substance abuse and Impulsivity  Risk Reduction Factors:   Responsible for children under 26 years of age, Sense of responsibility to family, Positive social support, Positive therapeutic relationship, and Positive coping skills or problem solving skills  Continued Clinical Symptoms:  More than one psychiatric diagnosis Previous Psychiatric Diagnoses and Treatments Medical Diagnoses and Treatments/Surgeries  Cognitive Features That Contribute To Risk:  None    Suicide Risk:  Minimal: No identifiable suicidal ideation.  Patients presenting with no risk factors but with morbid ruminations; may be classified as minimal risk based on the severity of the depressive symptoms. Patient denies suicidal ideation at time of discharge. No manic features. No overt psychotic symptoms. She is future-oriented and motivated toward recovery.    Follow-up Information     Sherril Camelia PARAS, NP Follow up on 04/08/2024.   Specialty: Behavioral Health Why: You have an appointment for medication management services on 04/08/24  at 10:15 am, Virtual telehealth. Contact information: [address] [place] [address] [phone]         [place] Go on 04/02/2024.   Specialty: Professional Counselor Why: Please go to this provider on 04/02/24 at 9:00 am for an assessment, to obtain  services. You may also go Monday through Friday, from 9 am to 1 pm. Contact information: [address]  [address] [place] [address] [phone]         [facility]. Call.   Specialty: Hospice and Palliative Medicine Why: Please call this provider personally if you wish to schedule an appointment for grief/bereavement therapy services. Contact information: [address] [facility] [place]  [address] [phone]                Plan Of Care/Follow-up recommendations:  See discharge summary.   Blair Chiquita Hint, NP 04/01/2024, 10:39 AM

## 2024-04-01 NOTE — Discharge Summary (Signed)
 Physician Discharge Summary Note  Patient:  Elizabeth Cameron is an 28 y.o., female MRN:  [id number] DOB:  1995/12/11 Patient phone:  [phone] (home)  Patient address:   [address] [place] [address],  Total Time spent with patient: 30 minutes  Date of Admission:  03/26/2024 Date of Discharge: 04/01/24   Reason for Admission:  Elizabeth Cameron is a 28 year old African-American female with prior psychiatric diagnoses significant for MDD recurrent severe, bipolar affective disorder, mixed severe, cannabis abuse, ADHD combined type, oppositional defiant disorder, PTSD, and insomnia.  Patient presents to [facility] from [facility] behavioral health urgent care for worsening depression resulting in suicidal ideation with specific plans to harm herself when her 3 children are at school in the context of multi-family member deaths within the past 5 years.   [name] is planned for discharge. She is established with outpatient medication management services and has been referred for outpatient therapy services. She is future oriented and reports that she cannot wait to see her children whom she identified as protective factors.  She denies any current suicidal ideation, intent, or plan and homicidal ideation at the time of discharge.  She is aware of follow-up plans and demonstrates insight into the importance of ongoing treatment. No current safety concerns or acute psychiatric symptoms observed.   Principal Problem: MDD (major depressive disorder), recurrent episode, severe (HCC) Discharge Diagnoses: Principal Problem:   MDD (major depressive disorder), recurrent episode, severe (HCC)   Past Psychiatric History: Previous Psych Diagnoses: MDD recurrent severe, bipolar disorder mixed, ADHD, ODD, PTSD, and insomnia Prior inpatient treatment: Yes, at [facility] child and adolescent unit in 2011 Current/prior outpatient treatment: Yes, currently receiving  [facility] and therapy at [facility] Prior rehab hx: Denies Psychotherapy hx: Yes History of suicide: Denies History of homicide or aggression: Denies Psychiatric medication history: Patient has been on trial lamotrigine, prazosin Psychiatric medication compliance history: Yes Neuromodulation history: Denies Current Psychiatrist: Yes, at [facility] Current therapist: Yes, [facility]  Past Medical History:  Past Medical History:  Diagnosis Date   ADHD (attention deficit hyperactivity disorder)    vyvanse , none in a couple of weeks due to running out   Anxiety    Bipolar 1 disorder (HCC)    Depression    Hypertension    takes Procardia 30mg    Varicella    remote history    Past Surgical History:  Procedure Laterality Date   ROOT CANAL     Family History:  Family History  Problem Relation Age of Onset   Depression Mother        Lorazepam, prestige   Anxiety disorder Mother    Bipolar disorder Mother        seroquel   Hypertension Father    Depression Father    Anxiety disorder Father    Heart disease Father    Hypertension Maternal Grandmother    Diabetes Maternal Grandmother    Hypertension Maternal Grandfather    Diabetes Maternal Grandfather    Schizophrenia Paternal Grandmother    Dementia Paternal Grandmother    Family Psychiatric  History: See H&P  Social History:  Social History   Substance and Sexual Activity  Alcohol Use Not Currently   Comment: occ     Social History   Substance and Sexual Activity  Drug Use Not Currently   Types: Marijuana   Comment: occasional use.  2 blunts.    Social History  Socioeconomic History   Marital status: Single    Spouse name: Not on file   Number of children: Not on file   Years of education: Not on file   Highest education level: Not on file  Occupational History   Occupation: student    Comment: rising 11th grade.    Tobacco Use   Smoking  status: Former    Current packs/day: 0.10    Average packs/day: 0.1 packs/day for 0.6 years (0.1 ttl pk-yrs)    Types: Cigarettes    Passive exposure: Never   Smokeless tobacco: Never   Tobacco comments:    Gets nauseous when she smokes too much.   Interested in engineer, maintenance (it) for anxiety, stress  Vaping Use   Vaping status: Never Used  Substance and Sexual Activity   Alcohol use: Not Currently    Comment: occ   Drug use: Not Currently    Types: Marijuana    Comment: occasional use.  2 blunts.   Sexual activity: Yes    Partners: Male    Birth control/protection: Condom, Patch  Other Topics Concern   Not on file  Social History Narrative   Wants to be a warden/ranger.  Wants to go college.     Social Drivers of Corporate Investment Banker Strain: Not on File (09/23/2021)   Received from [facility]    Financial Resource Strain: 0  Food Insecurity: No Food Insecurity (03/27/2024)   Hunger Vital Sign    Worried About Running Out of Food in the Last Year: Never true    Ran Out of Food in the Last Year: Never true  Transportation Needs: Unmet Transportation Needs (03/27/2024)   PRAPARE - Administrator, Civil Service (Medical): Yes    Lack of Transportation (Non-Medical): Yes  Physical Activity: Not on File (09/23/2021)   Received from [facility]   Physical Activity    Physical Activity: 0  Stress: Not on File (09/23/2021)   Received from [facility]   Stress    Stress: 0  Social Connections: Not on File (02/13/2023)   Received from [facility]   Social Connections    Connectedness: 0    Hospital Course:  During the patient's hospitalization, patient had extensive initial psychiatric evaluation, and follow-up psychiatric evaluations every day.  Psychiatric diagnoses provided upon initial assessment:  Principal Problem:   MDD (major depressive disorder), recurrent episode, severe (HCC)  Upon admission, the patient's psychiatric medication regimen  was reviewed and adjusted as follows:  Lamictal 200 mg daily continued for mood stabilization. Vyvanse  20 mg daily held; previously prescribed for ADHD. Remeron 15 mg nightly continued for depression and sleep. Prazosin 5 mg nightly continued for management of trauma-related nightmares. Gabapentin 300 mg twice daily continued for adjunctive mood and anxiety management. Zoloft 25 mg nightly initiated during hospitalization for treatment of depression and anxiety symptoms.  During the hospitalization, other adjustments were made to the patient's psychiatric medication regimen as follows:  Zoloft discontinued, with optimization of Remeron for mood and anxiety symptoms. Vyvanse  20 mg daily  held during hospitalization (previously prescribed for ADHD). Remeron optimized to 30 mg nightly for depression and sleep. Lamictal 200 mg daily continued for mood stabilization. Prazosin 5 mg nightly continued for management of nightmares. Gabapentin 300 mg twice daily continued for anxiety.  Patient's care was discussed during the interdisciplinary team meeting every day during the hospitalization.  The patient denies having side effects to prescribed psychiatric medication.  Gradually, patient started adjusting  to milieu. The patient was evaluated each day by a clinical provider to ascertain response to treatment. Improvement was noted by the patient's report of decreasing symptoms, improved sleep and appetite, affect, medication tolerance, behavior, and participation in unit programming.  Patient was asked each day to complete a self inventory noting mood, mental status, pain, new symptoms, anxiety and concerns.    Symptoms were reported as significantly decreased or resolved completely by discharge.   On day of discharge, the patient reports that their mood is stable. The patient denied having suicidal thoughts for more than 48 hours prior to discharge.  Patient denies having homicidal thoughts.   Patient denies having auditory hallucinations.  Patient denies any visual hallucinations or other symptoms of psychosis. The patient was motivated to continue taking medication with a goal of continued improvement in mental health.   The patient reports their target psychiatric symptoms of depression, anxiety and insomnia responded well to the psychiatric medications, and the patient reports overall benefit other psychiatric hospitalization. Supportive psychotherapy was provided to the patient. The patient also participated in regular group therapy while hospitalized. Coping skills, problem solving as well as relaxation therapies were also part of the unit programming.  Labs were reviewed with the patient, and abnormal results were discussed with the patient.  The patient is able to verbalize their individual safety plan to this provider.  # It is recommended to the patient to continue psychiatric medications as prescribed, after discharge from the hospital.    # It is recommended to the patient to follow up with your outpatient psychiatric provider and PCP.  # It was discussed with the patient, the impact of alcohol, drugs, tobacco have been there overall psychiatric and medical wellbeing, and total abstinence from substance use was recommended the patient.ed.  # Prescriptions provided or sent directly to preferred pharmacy at discharge. Patient agreeable to plan. Given opportunity to ask questions. Appears to feel comfortable with discharge.    # In the event of worsening symptoms, the patient is instructed to call the crisis hotline, 911 and or go to the nearest ED for appropriate evaluation and treatment of symptoms. To follow-up with primary care provider for other medical issues, concerns and or health care needs  # Patient was discharged home with a plan to follow up as noted below.   Physical Findings: AIMS:  , ,N/A  ,  ,  ,  ,   CIWA:   N/A COWS:   N/A  Musculoskeletal: Strength &  Muscle Tone: within normal limits Gait & Station: normal Patient leans: N/A   Psychiatric Specialty Exam:  Presentation  General Appearance:  Casual  Eye Contact: Good  Speech: Clear and Coherent; Normal Rate  Speech Volume: Normal  Handedness: Right   Mood and Affect  Mood: Euthymic  Affect: Appropriate; Full Range   Thought Process  Thought Processes: Coherent; Linear  Descriptions of Associations:Intact  Orientation:Full (Time, Place and Person)  Thought Content:Logical  History of Schizophrenia/Schizoaffective disorder:No  Duration of Psychotic Symptoms:No data recorded Hallucinations:Hallucinations: None  Ideas of Reference:None  Suicidal Thoughts:Suicidal Thoughts: No SI Active Intent and/or Plan: -- (Denies) SI Passive Intent and/or Plan: -- (Denies)  Homicidal Thoughts:Homicidal Thoughts: No   Sensorium  Memory: Immediate Good; Recent Good; Remote Good  Judgment: Intact  Insight: Present   Executive Functions  Concentration: Good  Attention Span: Good  Recall: Good  Fund of Knowledge: Good  Language: Good   Psychomotor Activity  Psychomotor Activity: Psychomotor Activity: Normal   Assets  Assets: Manufacturing Systems Engineer; Desire for Improvement; Housing; Resilience; Social Support   Sleep  Sleep: Sleep: Good  Estimated Sleeping Duration (Last 24 Hours): 5.00-6.00 hours   Physical Exam: Physical Exam ROS Blood pressure 128/70, pulse 84, temperature 98.6 F (37 C), temperature source Oral, resp. rate 18, SpO2 100%. There is no height or weight on file to calculate BMI.   Social History   Tobacco Use  Smoking Status Former   Current packs/day: 0.10   Average packs/day: 0.1 packs/day for 0.6 years (0.1 ttl pk-yrs)   Types: Cigarettes   Passive exposure: Never  Smokeless Tobacco Never  Tobacco Comments   Gets nauseous when she smokes too much.   Interested in learning coping skills for anxiety,  stress   Tobacco Cessation:  N/A, patient does not currently use tobacco products   Blood Alcohol level:  Lab Results  Component Value Date   [facility] <15 03/26/2024   ETH <11 12/08/2011    Metabolic Disorder Labs:  Lab Results  Component Value Date   HGBA1C 5.2 03/26/2024   MPG 102.54 03/26/2024   MPG 105 12/12/2011   No results found for: PROLACTIN Lab Results  Component Value Date   CHOL 211 (H) 03/26/2024   TRIG 67 03/26/2024   HDL 73 03/26/2024   CHOLHDL 2.9 03/26/2024   VLDL 13 03/26/2024   LDLCALC 125 (H) 03/26/2024   LDLCALC 59 12/12/2011    See Psychiatric Specialty Exam and Suicide Risk Assessment completed by Attending Physician prior to discharge.  Discharge destination:  Home  Is patient on multiple antipsychotic therapies at discharge:  No   Has Patient had three or more failed trials of antipsychotic monotherapy by history:  No  Recommended Plan for Multiple Antipsychotic Therapies: NA  Discharge Instructions     Activity as tolerated - No restrictions   Complete by: As directed    Diet - low sodium heart healthy   Complete by: As directed       Allergies as of 04/01/2024       Reactions   Penicillins Anaphylaxis   Metronidazole  Other (See Comments)   Abdominal pain        Medication List     STOP taking these medications    ALPRAZolam 1 MG tablet Commonly known as: XANAX   Vyvanse  20 MG capsule Generic drug: lisdexamfetamine       TAKE these medications      Indication  albuterol  108 (90 Base) MCG/ACT inhaler Commonly known as: VENTOLIN  HFA Inhale 1-2 puffs into the lungs every 6 (six) hours as needed for wheezing or shortness of breath.  Indication: Shortness of breath/Wheezing   clotrimazole 1 % vaginal cream Commonly known as: GYNE-LOTRIMIN Place 1 Applicatorful vaginally at bedtime.  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 What changed: when to take this  Indication: Generalized Anxiety Disorder   Haloette 0.12-0.015 MG/24HR vaginal ring Generic drug: etonogestrel -ethinyl estradiol  Place 1 each vaginally every 28 (twenty-eight) days.  Indication: Birth Control Treatment   hydrOXYzine  25 MG tablet Commonly known as: ATARAX  Take 1 tablet (25 mg total) by mouth 3 (three) times daily as needed for anxiety.  Indication: Feeling Anxious, Feeling Tense   lamoTRIgine 200 MG tablet Commonly known as: LAMICTAL Take 1 tablet (200 mg total) by mouth daily. Start taking on: April 02, 2024 What changed:  medication strength how  much to take  Indication: Mood Control   mirtazapine 30 MG tablet Commonly known as: REMERON Take 1 tablet (30 mg total) by mouth at bedtime. What changed:  medication strength how much to take  Indication: Major Depressive Disorder   NIFEdipine 60 MG 24 hr tablet Commonly known as: ADALAT CC Take 60 mg by mouth daily.  Indication: High Blood Pressure   prazosin 5 MG capsule Commonly known as: MINIPRESS Take 1 capsule (5 mg total) by mouth at bedtime.  Indication: Frightening Dreams   traZODone 50 MG tablet Commonly known as: DESYREL Take 1 tablet (50 mg total) by mouth at bedtime as needed for sleep.  Indication: Trouble Sleeping        Follow-up Information     Sherril Camelia PARAS, NP Follow up on 04/08/2024.   Specialty: Behavioral Health Why: You have an appointment for medication management services on 04/08/24 at 10:15 am, Virtual telehealth. Contact information: [address] [place] [address] [phone]         [place] Go on 04/02/2024.   Specialty: Professional Counselor Why: Please go to this provider on 04/02/24 at 9:00 am for an assessment, to obtain  services. You may also go Monday through Friday, from 9 am to 1 pm. Contact information: [address]  [address] [place] [address] [phone]         [facility]. Call.   Specialty: Hospice and Palliative Medicine Why: Please call this provider personally if you wish to schedule an appointment for grief/bereavement therapy services. Contact information: [address] [facility] [place]  [address] [phone]                Follow-up recommendations:   Activity: as tolerated  Diet: heart healthy  Other: -Follow-up with your outpatient psychiatric provider -instructions on appointment date, time, and address (location) are provided to you in discharge paperwork.  -Take your psychiatric medications as prescribed at discharge - instructions are provided to you in the discharge paperwork  -Follow-up with outpatient primary care doctor and other specialists -for management of preventative medicine and chronic medical disease: Hypertension. Genital pruritus   -Testing: Follow-up with outpatient provider for abnormal lab results: Elevated Cholesterol (211) and LDL Cholesterol (125).   -If you are prescribed an atypical antipsychotic medication, we recommend that your outpatient psychiatrist follow routine screening for side effects within 3 months of discharge, including monitoring: AIMS scale, height, weight, blood pressure, fasting lipid panel, HbA1c, and fasting blood sugar.   -Recommend total abstinence from alcohol, tobacco, and other illicit drug use at discharge.   -If your psychiatric symptoms recur, worsen, or if you have side effects to your psychiatric medications, call your outpatient psychiatric provider, 911, [phone] or go to the nearest emergency department.  -If suicidal thoughts occur, immediately call your outpatient psychiatric provider, 911, [phone] or go to the nearest emergency department.   Comments:  Follow all instructions as recommended.   Signed: Blair Chiquita Hint, NP 04/01/2024, 10:46 AM

## 2024-04-01 NOTE — Progress Notes (Signed)
 Claude Champ  D/C'd Home per MD order.  Discussed with the patient and all questions fully answered.   An After Visit Summary was printed and given to the patient. Patient received prescription.  D/c education completed with patient including follow up instructions, medication list, d/c activities limitations if indicated, with other d/c instructions as indicated by MD - patient able to verbalize understanding, all questions fully answered. Patient denies SI/HI/AVH, and her belongings given to her at discharge.   Patient instructed to return to ED, call 911, or call MD for any changes in condition.   Patient escorted main entrance, and D/C home via private auto.  Joaquin [place] Doing 04/01/2024 11:28 AM

## 2024-04-01 NOTE — Transportation (Signed)
 04/01/2024  Elizabeth Cameron DOB: 20-Sep-1995 MRN: [id number]   RIDER WAIVER AND RELEASE OF LIABILITY  For the purposes of helping with transportation needs, [facility] partners with outside transportation providers (taxi companies, Vander, catering manager.) to give [employer] patients or other approved people the choice of on-demand rides Public Librarian) to our buildings for non-emergency visits.  By using [employer], I, the person signing this document, on behalf of myself and/or any legal minors (in my care using the [employer]), agree:  Science Writer given to me are supplied by independent, outside transportation providers who do not work for, or have any affiliation with, [employer]. [place] is not a transportation company. [facility] has no control over the quality or safety of the rides I get using [employer]. [facility] has no control over whether any outside ride will happen on time or not. [facility] gives no guarantee on the reliability, quality, safety, or availability on any rides, or that no mistakes will happen. I know and accept that traveling by vehicle (car, truck, SVU, fleeta, bus, taxi, etc.) has risks of serious injuries such as disability, being paralyzed, and death. I know and agree the risk of using [employer] is mine alone, and not [employer]. [employer] are provided as is and as are available. The transportation providers are in charge for all inspections and care of the vehicles used to provide these rides. I agree not to take legal action against [facility], its agents, employees, officers, directors, representatives, insurers, attorneys, assigns, successors, subsidiaries, and affiliates at any time for any reasons related directly or indirectly to using [employer]. I also agree not to take legal action against [facility] or its affiliates for any injury, death, or damage to property caused by or related to using  [employer]. I have read this Waiver and Release of Liability, and I understand the terms used in it and their legal meaning. This Waiver is freely and voluntarily given with the understanding that my right (or any legal minors) to legal action against [place] relating to [employer] is knowingly given up to use these services.   I attest that I read the Ride Waiver and Release of Liability to [name], gave [name] the opportunity to ask questions and answered the questions asked (if any). I affirm that [name] then provided consent for assistance with transportation.

## 2024-04-01 NOTE — Progress Notes (Incomplete)
 [facility] MD Progress Note  04/01/2024 8:47 AM Elizabeth Cameron  MRN:  [id number]  Principal Problem: MDD (major depressive disorder), recurrent episode, severe (HCC) Diagnosis: Principal Problem:   MDD (major depressive disorder), recurrent episode, severe (HCC)  Reason for admission:  Elizabeth Cameron is a 28 year old African-American female with prior psychiatric diagnoses significant for MDD recurrent severe, bipolar affective disorder, mixed severe, cannabis abuse, ADHD combined type, oppositional defiant disorder, PTSD, and insomnia.  Patient presents to [facility] from [facility] behavioral health urgent care for worsening depression resulting in suicidal ideation with specific plans to harm herself when her 3 children are at school in the context of multi-family member deaths within the past 5 years.    24 hour chart review The patient's chart was reviewed and nursing notes were reviewed. Significant vitals signs: stable. The patient's case was discussed in multidisciplinary team meeting. Per [facility], patient was taking medications appropriately. The following as needed medications were given: trazodone. Per nursing, patient is calm and cooperative and attended group sessions.    Today's assessment notes:  The patient was seen in the milieu, no acute distress. On assessment, the patient feels good today. Patient feels the group sessions have been helpful and she is learning more about forgiving.   Patient reports having good sleep.  Patient reports good appetite. Patient feels that the medications have been helpful regarding her mood.  She notes that the clotrimazole has helped with the genital pruritus.  Patient denies current SI, HI, AVH.  Past Psychiatric History: Previous Psych Diagnoses: MDD recurrent severe, bipolar disorder mixed, ADHD, ODD, PTSD, and insomnia Prior inpatient treatment: Yes, at [facility] child and adolescent unit in 2011 Current/prior outpatient  treatment: Yes, currently receiving [facility] and therapy at [facility] Prior rehab hx: Denies Psychotherapy hx: Yes History of suicide: Denies History of homicide or aggression: Denies Psychiatric medication history: Patient has been on trial lamotrigine, prazosin Psychiatric medication compliance history: Yes Neuromodulation history: Denies Current Psychiatrist: Yes, at [facility] Current therapist: Yes, [facility]  Past Medical History:  Past Medical History:  Diagnosis Date   ADHD (attention deficit hyperactivity disorder)    vyvanse , none in a couple of weeks due to running out   Anxiety    Bipolar 1 disorder (HCC)    Depression    Hypertension    takes Procardia 30mg    Varicella    remote history    Past Surgical History:  Procedure Laterality Date   ROOT CANAL     Family History:  Family History  Problem Relation Age of Onset   Depression Mother        Lorazepam, prestige   Anxiety disorder Mother    Bipolar disorder Mother        seroquel   Hypertension Father    Depression Father    Anxiety disorder Father    Heart disease Father    Hypertension Maternal Grandmother    Diabetes Maternal Grandmother    Hypertension Maternal Grandfather    Diabetes Maternal Grandfather    Schizophrenia Paternal Grandmother    Dementia Paternal Grandmother    Family Psychiatric  History: See H&P  Social History:  Social History   Substance and Sexual Activity  Alcohol Use Not Currently   Comment: occ     Social History   Substance and Sexual Activity  Drug Use Not Currently   Types: Marijuana   Comment: occasional use.  2 blunts.    Social  History   Socioeconomic History   Marital status: Single    Spouse name: Not on file   Number of children: Not on file   Years of education: Not on file   Highest education level: Not on file  Occupational History   Occupation: student    Comment: rising  11th grade.    Tobacco Use   Smoking status: Former    Current packs/day: 0.10    Average packs/day: 0.1 packs/day for 0.6 years (0.1 ttl pk-yrs)    Types: Cigarettes    Passive exposure: Never   Smokeless tobacco: Never   Tobacco comments:    Gets nauseous when she smokes too much.   Interested in engineer, maintenance (it) for anxiety, stress  Vaping Use   Vaping status: Never Used  Substance and Sexual Activity   Alcohol use: Not Currently    Comment: occ   Drug use: Not Currently    Types: Marijuana    Comment: occasional use.  2 blunts.   Sexual activity: Yes    Partners: Male    Birth control/protection: Condom, Patch  Other Topics Concern   Not on file  Social History Narrative   Wants to be a warden/ranger.  Wants to go college.     Social Drivers of Corporate Investment Banker Strain: Not on File (09/23/2021)   Received from [facility]    Financial Resource Strain: 0  Food Insecurity: No Food Insecurity (03/27/2024)   Hunger Vital Sign    Worried About Running Out of Food in the Last Year: Never true    Ran Out of Food in the Last Year: Never true  Transportation Needs: Unmet Transportation Needs (03/27/2024)   PRAPARE - Administrator, Civil Service (Medical): Yes    Lack of Transportation (Non-Medical): Yes  Physical Activity: Not on File (09/23/2021)   Received from [facility]   Physical Activity    Physical Activity: 0  Stress: Not on File (09/23/2021)   Received from [facility]   Stress    Stress: 0  Social Connections: Not on File (02/13/2023)   Received from [facility]   Social Connections    Connectedness: 0    Current Medications: Current Facility-Administered Medications  Medication Dose Route Frequency Provider Last Rate Last Admin   albuterol  (VENTOLIN  HFA) 108 (90 Base) MCG/ACT inhaler 1-2 puff  1-2 puff Inhalation Q6H PRN Ntuen, Tina C, FNP       clotrimazole (GYNE-LOTRIMIN) vaginal cream 1 Applicatorful  1 Applicatorful  Vaginal QHS Hoang, Daniela B, MD   1 Applicatorful at 03/31/24 2139   haloperidol (HALDOL) tablet 5 mg  5 mg Oral TID PRN Onuoha, Chinwendu V, NP       And   diphenhydrAMINE (BENADRYL) capsule 50 mg  50 mg Oral TID PRN Onuoha, Chinwendu V, NP       haloperidol lactate (HALDOL) injection 5 mg  5 mg Intramuscular TID PRN Onuoha, Chinwendu V, NP       And   diphenhydrAMINE (BENADRYL) injection 50 mg  50 mg Intramuscular TID PRN Onuoha, Chinwendu V, NP       And   LORazepam (ATIVAN) injection 2 mg  2 mg Intramuscular TID PRN Onuoha, Chinwendu V, NP       haloperidol lactate (HALDOL) injection 10 mg  10 mg Intramuscular TID PRN Onuoha, Chinwendu V, NP       And   diphenhydrAMINE (BENADRYL) injection 50 mg  50 mg Intramuscular TID PRN Onuoha,  Chinwendu V, NP       And   LORazepam (ATIVAN) injection 2 mg  2 mg Intramuscular TID PRN Onuoha, Chinwendu V, NP       gabapentin (NEURONTIN) capsule 300 mg  300 mg Oral BID Ntuen, Tina C, FNP   300 mg at 04/01/24 9171   hydrOXYzine  (ATARAX ) tablet 25 mg  25 mg Oral TID PRN Onuoha, Chinwendu V, NP   25 mg at 03/28/24 2105   lamoTRIgine (LAMICTAL) tablet 200 mg  200 mg Oral Daily Ntuen, Tina C, FNP   200 mg at 04/01/24 0828   mirtazapine (REMERON) tablet 30 mg  30 mg Oral QHS Hoang, Daniela B, MD   30 mg at 03/31/24 2139   NIFEdipine (PROCARDIA-XL/NIFEDICAL-XL) 24 hr tablet 60 mg  60 mg Oral Daily Ntuen, Tina C, FNP   60 mg at 04/01/24 9171   ondansetron  (ZOFRAN -ODT) disintegrating tablet 4 mg  4 mg Oral Q8H PRN Ntuen, Tina C, FNP   4 mg at 03/28/24 1141   prazosin (MINIPRESS) capsule 5 mg  5 mg Oral QHS Ntuen, Tina C, FNP   5 mg at 03/31/24 2139   traZODone (DESYREL) tablet 50 mg  50 mg Oral QHS PRN Onuoha, Chinwendu V, NP   50 mg at 03/31/24 2139   Lab Results:  No results found for this or any previous visit (from the past 48 hours).   Blood Alcohol level:  Lab Results  Component Value Date   [facility] <15 03/26/2024   ETH <11 12/08/2011    Metabolic  Disorder Labs: Lab Results  Component Value Date   HGBA1C 5.2 03/26/2024   MPG 102.54 03/26/2024   MPG 105 12/12/2011   No results found for: PROLACTIN Lab Results  Component Value Date   CHOL 211 (H) 03/26/2024   TRIG 67 03/26/2024   HDL 73 03/26/2024   CHOLHDL 2.9 03/26/2024   VLDL 13 03/26/2024   LDLCALC 125 (H) 03/26/2024   LDLCALC 59 12/12/2011   Physical Findings: AIMS:  ,  ,  ,  ,  ,  ,   CIWA:    COWS:     Musculoskeletal: Strength & Muscle Tone: within normal limits Gait & Station: normal Patient leans: N/A  Psychiatric Specialty Exam:  Presentation  General Appearance:  Appropriate for Environment; Casual; Fairly Groomed  Eye Contact: Good  Speech: Clear and Coherent; Normal Rate  Speech Volume: Normal  Handedness: Right  Mood and Affect  Mood: Euthymic  Affect: Congruent; Appropriate; Full Range  Thought Process  Thought Processes: Coherent; Goal Directed; Linear  Descriptions of Associations:Intact  Orientation:Full (Time, Place and Person)  Thought Content:Logical  History of Schizophrenia/Schizoaffective disorder:No  Duration of Psychotic Symptoms:NA Hallucinations:Hallucinations: None  Ideas of Reference:None  Suicidal Thoughts:Suicidal Thoughts: No  Homicidal Thoughts:Homicidal Thoughts: No  Sensorium  Memory: Immediate Good; Recent Good; Remote Good  Judgment: Good  Insight: Good  Executive Functions  Concentration: Good  Attention Span: Good  Recall: Good  Fund of Knowledge: Good  Language: Good  Psychomotor Activity  Psychomotor Activity: Psychomotor Activity: Normal  Assets  Assets: Communication Skills; Desire for Improvement; Resilience  Sleep  Sleep: Sleep: Fair  Physical Exam  General: Pleasant, well-appearing . No acute distress. Pulmonary: Normal effort. No wheezing or rales. Skin: No obvious rash or lesions. Neuro: A&Ox3.No focal deficit.  Review of Systems  None  reported today  Blood pressure 128/70, pulse 84, temperature 98.6 F (37 C), temperature source Oral, resp. rate 18, SpO2 100%. There is no  height or weight on file to calculate BMI.  Treatment Plan Summary: Daily contact with patient to assess and evaluate symptoms and progress in treatment and Medication management  Observation Level/Precautions:  15 minute checks  Laboratory:   CBC: Within normal limits Chemistry Profile: CO2 21 low, creatinine 1.06 high, otherwise normal Folic Acid: N/A GGT: N/A HbAIC: N/A HCG: Urine pregnancy test negative UDS: Negative for substances UA: Colitis cloudy, protein 30, bacteria many. Vitamin B-12: NA  Psychotherapy: Therapeutic milieu  Medications: See MAR  Consultations: None  Discharge Concerns: Safety  Estimated LOS: 3 to 7 days  Other:      Assessment: Elizabeth Cameron is a 28 year old African-American female with prior psychiatric diagnoses significant for MDD recurrent severe, bipolar affective disorder mixed severe, cannabis abuse, ADHD combined type, oppositional defiant disorder, PTSD, and insomnia.  Patient presents to [facility] from [facility] behavioral health urgent care for worsening depression resulting in suicidal ideation with specific plans to harm herself when her 3 children are at school in the context of multi-family deaths within the past 5 years.    Patient appears to be doing fairly today.  Discussed that she is on multiple psychotropic medications and to avoid polypharmacy, will discontinue Zoloft and optimize Remeron at this time.  For her sleep, will focus on using the as needed trazodone and reiterated that she is also on prazosin which may have its own sedating properties.  Will also start clotrimazole cream for her genital pruritus.    Plans: Psychotropic medications -- Continue Remeron 30 mg p.o. daily for depression and sleep -- Continue Lamictal 200 mg p.o. daily for mood  stabilization -- Continue prazosin 5 mg capsule p.o. daily at bedtime for nightmares -- Continue gabapentin 300 mg tablet p.o. twice daily for anxiety -- Continue trazodone tablet 50 mg p.o. q. nightlyPRN at bedtime for insomnia -- Continue hydroxyzine  tablet 25 mg p.o. 3 times daily as needed for anxiety   - Discontinue Zoloft (optimizing Remeron instead) -- Hold Vyvanse  20 mg capsule p.o. daily for ADHD  Continue BH Agitation Protocol    Medications for other medical conditions: - Continue clotrimazole vaginal cream daily at night for 7 doses for genital pruritus -- Continue ventilator HFA 108 90 base micrograms/ACT inhaler 1 to 2 puffs every 6 hours as needed for wheezing shortness of breath -- Continue Procardia XL 24-hour tablet 60 mg p.o. daily for high blood pressure   Other PRN Medications  -Acetaminophen  650 mg every 6 as needed/mild pain  -Maalox 30 mL oral every 4 as needed/digestion  -Magnesium hydroxide 30 mL daily as needed/mild constipation  --Zofran  disintegrating tab 4 mg p.o. every 8 hours as needed for nausea and vomiting   --The risks/benefits/side-effects/alternatives to this medication were discussed in detail with the patient and time was given for questions. The patient consents to medication trial.   -- Encouraged patient to participate in unit milieu and in scheduled group therapies    Safety and Monitoring:  Voluntary admission to inpatient psychiatric unit for safety, stabilization and treatment  Daily contact with patient to assess and evaluate symptoms and progress in treatment  Patient's case to be discussed in multi-disciplinary team meeting  Observation Level : q15 minute checks  Vital signs: q12 hours  Precautions: suicide, but pt currently verbally contracts for safety on unit?    Discharge Planning:  Social work and case management to assist with discharge planning and identification of hospital follow-up needs prior to discharge  Estimated LOS:  5-7?days  Discharge Concerns: Need to establish Safety plan; Medication compliance and effectiveness  Discharge Goals: Return home with outpatient referrals for mental health follow-up including medication management/psychotherapy.      I certify that inpatient services furnished can reasonably be expected to improve the patient's condition.  Blair Chiquita Hint, NP 04/01/2024, 8:47 AM Patient ID: Elizabeth Cameron, female   DOB: 1995/09/27, 28 y.o.   MRN: [id number] Patient ID: Elizabeth Cameron, female   DOB: 06-Mar-1996, 28 y.o.   MRN: [id number]

## 2024-04-29 ENCOUNTER — Ambulatory Visit: Admitting: Obstetrics and Gynecology

## 2024-06-13 ENCOUNTER — Ambulatory Visit (INDEPENDENT_AMBULATORY_CARE_PROVIDER_SITE_OTHER): Payer: Self-pay

## 2024-06-13 ENCOUNTER — Other Ambulatory Visit (HOSPITAL_COMMUNITY)
Admission: RE | Admit: 2024-06-13 | Discharge: 2024-06-13 | Disposition: A | Source: Ambulatory Visit | Attending: Obstetrics | Admitting: Obstetrics

## 2024-06-13 VITALS — BP 139/85 | HR 81 | Wt 204.0 lb

## 2024-06-13 DIAGNOSIS — N898 Other specified noninflammatory disorders of vagina: Secondary | ICD-10-CM | POA: Insufficient documentation

## 2024-06-13 DIAGNOSIS — Z113 Encounter for screening for infections with a predominantly sexual mode of transmission: Secondary | ICD-10-CM

## 2024-06-13 NOTE — Progress Notes (Signed)
..  SUBJECTIVE:  29 y.o. female complains of possible exposure to STD and requests testing today.. Denies abnormal vaginal bleeding or significant pelvic pain or fever. No UTI symptoms.   No LMP recorded. (Menstrual status: Other).  OBJECTIVE:  She appears well, afebrile. Urine dipstick: not done.  ASSESSMENT:  Vaginal Discharge  STD Screening  PLAN:  GC, chlamydia, trichomonas, BVAG, CVAG, probe and HIV, RPR, hep B, Hep C blood work sent to lab. Treatment: To be determined once lab results are received ROV prn if symptoms persist or worsen.

## 2024-06-14 ENCOUNTER — Ambulatory Visit: Payer: Self-pay | Admitting: Obstetrics and Gynecology

## 2024-06-14 LAB — HEPATITIS B SURFACE ANTIGEN: Hepatitis B Surface Ag: NEGATIVE

## 2024-06-14 LAB — CERVICOVAGINAL ANCILLARY ONLY
Bacterial Vaginitis (gardnerella): NEGATIVE
Chlamydia: NEGATIVE
Comment: NEGATIVE
Comment: NEGATIVE
Comment: NEGATIVE
Comment: NORMAL
Neisseria Gonorrhea: NEGATIVE
Trichomonas: NEGATIVE

## 2024-06-14 LAB — SYPHILIS: RPR W/REFLEX TO RPR TITER AND TREPONEMAL ANTIBODIES, TRADITIONAL SCREENING AND DIAGNOSIS ALGORITHM: RPR Ser Ql: NONREACTIVE

## 2024-06-14 LAB — HEPATITIS C ANTIBODY: Hep C Virus Ab: NONREACTIVE

## 2024-06-14 LAB — HIV ANTIBODY (ROUTINE TESTING W REFLEX): HIV Screen 4th Generation wRfx: NONREACTIVE
